# Patient Record
Sex: Female | Born: 1960 | Race: White | Hispanic: No | Smoking: Current every day smoker
Health system: Southern US, Community
[De-identification: ages and names within clinical notes are randomized; demographics above are authoritative.]

## PROBLEM LIST (undated history)

## (undated) DIAGNOSIS — E119 Type 2 diabetes mellitus without complications: Secondary | ICD-10-CM

## (undated) DIAGNOSIS — I739 Peripheral vascular disease, unspecified: Secondary | ICD-10-CM

## (undated) DIAGNOSIS — I252 Old myocardial infarction: Secondary | ICD-10-CM

## (undated) DIAGNOSIS — J449 Chronic obstructive pulmonary disease, unspecified: Secondary | ICD-10-CM

## (undated) DIAGNOSIS — F32A Depression, unspecified: Secondary | ICD-10-CM

## (undated) DIAGNOSIS — E1121 Type 2 diabetes mellitus with diabetic nephropathy: Secondary | ICD-10-CM

## (undated) DIAGNOSIS — I10 Essential (primary) hypertension: Secondary | ICD-10-CM

## (undated) DIAGNOSIS — F419 Anxiety disorder, unspecified: Secondary | ICD-10-CM

## (undated) DIAGNOSIS — F329 Major depressive disorder, single episode, unspecified: Secondary | ICD-10-CM

## (undated) DIAGNOSIS — I2510 Atherosclerotic heart disease of native coronary artery without angina pectoris: Secondary | ICD-10-CM

## (undated) HISTORY — PX: AMPUTATION TOE: SHX6595

## (undated) HISTORY — DX: Type 2 diabetes mellitus with diabetic nephropathy: E11.21

## (undated) HISTORY — PX: KIDNEY SURGERY: SHX687

## (undated) HISTORY — DX: Chronic obstructive pulmonary disease, unspecified: J44.9

## (undated) HISTORY — DX: Peripheral vascular disease, unspecified: I73.9

## (undated) HISTORY — DX: Depression, unspecified: F32.A

## (undated) HISTORY — DX: Essential (primary) hypertension: I10

## (undated) HISTORY — DX: Anxiety disorder, unspecified: F41.9

## (undated) HISTORY — DX: Type 2 diabetes mellitus without complications: E11.9

## (undated) HISTORY — PX: CARDIAC SURGERY: SHX584

## (undated) HISTORY — PX: BREAST EXCISIONAL BIOPSY: SUR124

## (undated) HISTORY — DX: Old myocardial infarction: I25.2

## (undated) HISTORY — DX: Atherosclerotic heart disease of native coronary artery without angina pectoris: I25.10

---
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
## 1998-04-21 ENCOUNTER — Emergency Department (HOSPITAL_COMMUNITY): Admission: EM | Admit: 1998-04-21 | Discharge: 1998-04-21 | Payer: Self-pay | Admitting: Emergency Medicine

## 1999-02-14 ENCOUNTER — Encounter: Admission: RE | Admit: 1999-02-14 | Discharge: 1999-02-14 | Payer: Self-pay | Admitting: Internal Medicine

## 2014-12-05 DIAGNOSIS — I2510 Atherosclerotic heart disease of native coronary artery without angina pectoris: Secondary | ICD-10-CM | POA: Insufficient documentation

## 2014-12-05 DIAGNOSIS — I739 Peripheral vascular disease, unspecified: Secondary | ICD-10-CM | POA: Insufficient documentation

## 2014-12-06 DIAGNOSIS — F17210 Nicotine dependence, cigarettes, uncomplicated: Secondary | ICD-10-CM | POA: Insufficient documentation

## 2014-12-06 DIAGNOSIS — E1122 Type 2 diabetes mellitus with diabetic chronic kidney disease: Secondary | ICD-10-CM | POA: Insufficient documentation

## 2014-12-06 DIAGNOSIS — G894 Chronic pain syndrome: Secondary | ICD-10-CM | POA: Insufficient documentation

## 2015-06-08 DIAGNOSIS — F331 Major depressive disorder, recurrent, moderate: Secondary | ICD-10-CM | POA: Insufficient documentation

## 2015-09-10 DIAGNOSIS — J410 Simple chronic bronchitis: Secondary | ICD-10-CM | POA: Insufficient documentation

## 2015-09-10 DIAGNOSIS — M5137 Other intervertebral disc degeneration, lumbosacral region: Secondary | ICD-10-CM | POA: Insufficient documentation

## 2015-09-10 DIAGNOSIS — M51379 Other intervertebral disc degeneration, lumbosacral region without mention of lumbar back pain or lower extremity pain: Secondary | ICD-10-CM | POA: Insufficient documentation

## 2015-09-10 DIAGNOSIS — G8929 Other chronic pain: Secondary | ICD-10-CM | POA: Insufficient documentation

## 2015-09-10 DIAGNOSIS — M5136 Other intervertebral disc degeneration, lumbar region: Secondary | ICD-10-CM | POA: Insufficient documentation

## 2015-12-11 DIAGNOSIS — K219 Gastro-esophageal reflux disease without esophagitis: Secondary | ICD-10-CM | POA: Insufficient documentation

## 2016-01-21 DIAGNOSIS — F411 Generalized anxiety disorder: Secondary | ICD-10-CM | POA: Insufficient documentation

## 2016-05-08 DIAGNOSIS — M47817 Spondylosis without myelopathy or radiculopathy, lumbosacral region: Secondary | ICD-10-CM | POA: Insufficient documentation

## 2017-02-03 DIAGNOSIS — E1169 Type 2 diabetes mellitus with other specified complication: Secondary | ICD-10-CM | POA: Insufficient documentation

## 2017-02-03 DIAGNOSIS — E785 Hyperlipidemia, unspecified: Secondary | ICD-10-CM | POA: Insufficient documentation

## 2017-12-22 DIAGNOSIS — E1142 Type 2 diabetes mellitus with diabetic polyneuropathy: Secondary | ICD-10-CM | POA: Insufficient documentation

## 2017-12-22 DIAGNOSIS — E1165 Type 2 diabetes mellitus with hyperglycemia: Secondary | ICD-10-CM | POA: Insufficient documentation

## 2017-12-22 DIAGNOSIS — E118 Type 2 diabetes mellitus with unspecified complications: Secondary | ICD-10-CM | POA: Insufficient documentation

## 2019-10-20 ENCOUNTER — Other Ambulatory Visit: Payer: Self-pay

## 2019-10-21 ENCOUNTER — Encounter: Payer: Self-pay | Admitting: Family Medicine

## 2019-10-21 ENCOUNTER — Ambulatory Visit (INDEPENDENT_AMBULATORY_CARE_PROVIDER_SITE_OTHER): Payer: Medicare Other | Admitting: Family Medicine

## 2019-10-21 VITALS — BP 146/92 | HR 101 | Temp 96.2°F | Ht 65.0 in | Wt 142.8 lb

## 2019-10-21 DIAGNOSIS — E1122 Type 2 diabetes mellitus with diabetic chronic kidney disease: Secondary | ICD-10-CM | POA: Diagnosis not present

## 2019-10-21 DIAGNOSIS — E11628 Type 2 diabetes mellitus with other skin complications: Secondary | ICD-10-CM

## 2019-10-21 DIAGNOSIS — I2510 Atherosclerotic heart disease of native coronary artery without angina pectoris: Secondary | ICD-10-CM | POA: Diagnosis not present

## 2019-10-21 DIAGNOSIS — N183 Chronic kidney disease, stage 3 unspecified: Secondary | ICD-10-CM

## 2019-10-21 DIAGNOSIS — E1159 Type 2 diabetes mellitus with other circulatory complications: Secondary | ICD-10-CM

## 2019-10-21 DIAGNOSIS — I739 Peripheral vascular disease, unspecified: Secondary | ICD-10-CM

## 2019-10-21 DIAGNOSIS — E1169 Type 2 diabetes mellitus with other specified complication: Secondary | ICD-10-CM

## 2019-10-21 DIAGNOSIS — S98111A Complete traumatic amputation of right great toe, initial encounter: Secondary | ICD-10-CM

## 2019-10-21 DIAGNOSIS — E1142 Type 2 diabetes mellitus with diabetic polyneuropathy: Secondary | ICD-10-CM

## 2019-10-21 DIAGNOSIS — I10 Essential (primary) hypertension: Secondary | ICD-10-CM | POA: Insufficient documentation

## 2019-10-21 DIAGNOSIS — I152 Hypertension secondary to endocrine disorders: Secondary | ICD-10-CM

## 2019-10-21 DIAGNOSIS — E785 Hyperlipidemia, unspecified: Secondary | ICD-10-CM

## 2019-10-21 DIAGNOSIS — Z9861 Coronary angioplasty status: Secondary | ICD-10-CM

## 2019-10-21 DIAGNOSIS — J410 Simple chronic bronchitis: Secondary | ICD-10-CM

## 2019-10-21 LAB — BAYER DCA HB A1C WAIVED: HB A1C (BAYER DCA - WAIVED): 7.2 % — ABNORMAL HIGH (ref <7.0)

## 2019-10-21 MED ORDER — LISINOPRIL 10 MG PO TABS: 10.0000 mg | ORAL_TABLET | Freq: Every day | ORAL | 3 refills | Status: DC

## 2019-10-21 NOTE — Patient Instructions (Addendum)
Continue to monitor your blood sugars as we discussed and record them. Bring the log to your next appointment.  Take your medications as directed.    Goal Blood glucose:    Fasting (before meals) = 80 to 130   Within 2 hours of eating = less than 180   Understanding your Hemoglobin A1c:     Diabetes Mellitus and Nutrition    I think that you would greatly benefit from seeing a nutritionist. If this is something you are interested in, please call Dr Sykes at [phone] to schedule an appointment.   When you have diabetes (diabetes mellitus), it is very important to have healthy eating habits because your blood sugar (glucose) levels are greatly affected by what you eat and drink. Eating healthy foods in the appropriate amounts, at about the same times every day, can help you: Control your blood glucose. Lower your risk of heart disease. Improve your blood pressure. Reach or maintain a healthy weight.  Every person with diabetes is different, and each person has different needs for a meal plan. Your health care provider may recommend that you work with a diet and nutrition specialist (dietitian) to make a meal plan that is best for you. Your meal plan may vary depending on factors such as: The calories you need. The medicines you take. Your weight. Your blood glucose, blood pressure, and cholesterol levels. Your activity level. Other health conditions you have, such as heart or kidney disease.  How do carbohydrates affect me? Carbohydrates affect your blood glucose level more than any other type of food. Eating carbohydrates naturally increases the amount of glucose in your blood. Carbohydrate counting is a method for keeping track of how many carbohydrates you eat. Counting carbohydrates is important to keep your blood glucose at a healthy level, especially if you use insulin or take certain oral diabetes medicines. It is important to know how many carbohydrates you can safely  have in each meal. This is different for every person. Your dietitian can help you calculate how many carbohydrates you should have at each meal and for snack. Foods that contain carbohydrates include: Bread, cereal, rice, pasta, and crackers. Potatoes and corn. Peas, beans, and lentils. Milk and yogurt. Fruit and juice. Desserts, such as cakes, cookies, ice cream, and candy.  How does alcohol affect me? Alcohol can cause a sudden decrease in blood glucose (hypoglycemia), especially if you use insulin or take certain oral diabetes medicines. Hypoglycemia can be a life-threatening condition. Symptoms of hypoglycemia (sleepiness, dizziness, and confusion) are similar to symptoms of having too much alcohol. If your health care provider says that alcohol is safe for you, follow these guidelines: Limit alcohol intake to no more than 1 drink per day for nonpregnant women and 2 drinks per day for men. One drink equals 12 oz of beer, 5 oz of wine, or 1 oz of hard liquor. Do not drink on an empty stomach. Keep yourself hydrated with water, diet soda, or unsweetened iced tea. Keep in mind that regular soda, juice, and other mixers may contain a lot of sugar and must be counted as carbohydrates.  What are tips for following this plan?  Reading food labels Start by checking the serving size on the label. The amount of calories, carbohydrates, fats, and other nutrients listed on the label are based on one serving of the food. Many foods contain more than one serving per package. Check the total grams (g) of carbohydrates in one serving. You can calculate the   number of servings of carbohydrates in one serving by dividing the total carbohydrates by 15. For example, if a food has 30 g of total carbohydrates, it would be equal to 2 servings of carbohydrates. Check the number of grams (g) of saturated and trans fats in one serving. Choose foods that have low or no amount of these fats. Check the number of  milligrams (mg) of sodium in one serving. Most people should limit total sodium intake to less than 2,300 mg per day. Always check the nutrition information of foods labeled as "low-fat" or "nonfat". These foods may be higher in added sugar or refined carbohydrates and should be avoided. Talk to your dietitian to identify your daily goals for nutrients listed on the label.  Shopping Avoid buying canned, premade, or processed foods. These foods tend to be high in fat, sodium, and added sugar. Shop around the outside edge of the grocery store. This includes fresh fruits and vegetables, bulk grains, fresh meats, and fresh dairy.  Cooking Use low-heat cooking methods, such as baking, instead of high-heat cooking methods like deep frying. Cook using healthy oils, such as olive, canola, or sunflower oil. Avoid cooking with butter, cream, or high-fat meats.  Meal planning Eat meals and snacks regularly, preferably at the same times every day. Avoid going long periods of time without eating. Eat foods high in fiber, such as fresh fruits, vegetables, beans, and whole grains. Talk to your dietitian about how many servings of carbohydrates you can eat at each meal. Eat 4-6 ounces of lean protein each day, such as lean meat, chicken, fish, eggs, or tofu. 1 ounce is equal to 1 ounce of meat, chicken, or fish, 1 egg, or 1/4 cup of tofu. Eat some foods each day that contain healthy fats, such as avocado, nuts, seeds, and fish.  Lifestyle  Check your blood glucose regularly. Exercise at least 30 minutes 5 or more days each week, or as told by your health care provider. Take medicines as told by your health care provider. Do not use any products that contain nicotine or tobacco, such as cigarettes and e-cigarettes. If you need help quitting, ask your health care provider. Work with a counselor or diabetes educator to identify strategies to manage stress and any emotional and social challenges.  What are  some questions to ask my health care provider? Do I need to meet with a diabetes educator? Do I need to meet with a dietitian? What number can I call if I have questions? When are the best times to check my blood glucose?  Where to find more information: American Diabetes Association: diabetes.org/food-and-fitness/food Academy of Nutrition and Dietetics: www.eatright.org/resources/health/diseases-and-conditions/diabetes National Institute of Diabetes and Digestive and Kidney Diseases (NIH): www.niddk.nih.gov/health-information/diabetes/overview/diet-eating-physical-activity  Summary A healthy meal plan will help you control your blood glucose and maintain a healthy lifestyle. Working with a diet and nutrition specialist (dietitian) can help you make a meal plan that is best for you. Keep in mind that carbohydrates and alcohol have immediate effects on your blood glucose levels. It is important to count carbohydrates and to use alcohol carefully. This information is not intended to replace advice given to you by your health care provider. Make sure you discuss any questions you have with your health care provider. Document Released: 06/26/2005 Document Revised: 11/03/2016 Document Reviewed: 11/03/2016 Elsevier Interactive Patient Education  2018 [employer]  DASH Eating Plan DASH stands for "Dietary Approaches to Stop Hypertension." The DASH eating plan is a healthy eating plan that has   been shown to reduce high blood pressure (hypertension). Additional health benefits may include reducing the risk of type 2 diabetes mellitus, heart disease, and stroke. The DASH eating plan may also help with weight loss.  WHAT DO I NEED TO KNOW ABOUT THE DASH EATING PLAN? For the DASH eating plan, you will follow these general guidelines: Choose foods with a percent daily value for sodium of less than 5% (as listed on the food label). Use salt-free seasonings or herbs instead of table salt or sea  salt. Check with your health care provider or pharmacist before using salt substitutes. Eat lower-sodium products, often labeled as "lower sodium" or "no salt added." Eat fresh foods. Eat more vegetables, fruits, and low-fat dairy products. Choose whole grains. Look for the word "whole" as the first word in the ingredient list. Choose fish and skinless chicken or turkey more often than red meat. Limit fish, poultry, and meat to 6 oz (170 g) each day. Limit sweets, desserts, sugars, and sugary drinks. Choose heart-healthy fats. Limit cheese to 1 oz (28 g) per day. Eat more home-cooked food and less restaurant, buffet, and fast food. Limit fried foods. Cook foods using methods other than frying. Limit canned vegetables. If you do use them, rinse them well to decrease the sodium. When eating at a restaurant, ask that your food be prepared with less salt, or no salt if possible.  WHAT FOODS CAN I EAT? Seek help from a dietitian for individual calorie needs.  Grains Whole grain or whole wheat bread. Brown rice. Whole grain or whole wheat pasta. Quinoa, bulgur, and whole grain cereals. Low-sodium cereals. Corn or whole wheat flour tortillas. Whole grain cornbread. Whole grain crackers. Low-sodium crackers.  Vegetables Fresh or frozen vegetables (raw, steamed, roasted, or grilled). Low-sodium or reduced-sodium tomato and vegetable juices. Low-sodium or reduced-sodium tomato sauce and paste. Low-sodium or reduced-sodium canned vegetables.   Fruits All fresh, canned (in natural juice), or frozen fruits.  Meat and Other Protein Products Ground beef (85% or leaner), grass-fed beef, or beef trimmed of fat. Skinless chicken or turkey. Ground chicken or turkey. Pork trimmed of fat. All fish and seafood. Eggs. Dried beans, peas, or lentils. Unsalted nuts and seeds. Unsalted canned beans.  Dairy Low-fat dairy products, such as skim or 1% milk, 2% or reduced-fat cheeses, low-fat ricotta or cottage  cheese, or plain low-fat yogurt. Low-sodium or reduced-sodium cheeses.  Fats and Oils Tub margarines without trans fats. Light or reduced-fat mayonnaise and salad dressings (reduced sodium). Avocado. Safflower, olive, or canola oils. Natural peanut or almond butter.  Other Unsalted popcorn and pretzels. The items listed above may not be a complete list of recommended foods or beverages. Contact your dietitian for more options.  WHAT FOODS ARE NOT RECOMMENDED?  Grains White bread. White pasta. White rice. Refined cornbread. Bagels and croissants. Crackers that contain trans fat.  Vegetables Creamed or fried vegetables. Vegetables in a cheese sauce. Regular canned vegetables. Regular canned tomato sauce and paste. Regular tomato and vegetable juices.  Fruits Dried fruits. Canned fruit in light or heavy syrup. Fruit juice.  Meat and Other Protein Products Fatty cuts of meat. Ribs, chicken wings, bacon, sausage, bologna, salami, chitterlings, fatback, hot dogs, bratwurst, and packaged luncheon meats. Salted nuts and seeds. Canned beans with salt.  Dairy Whole or 2% milk, cream, half-and-half, and cream cheese. Whole-fat or sweetened yogurt. Full-fat cheeses or blue cheese. Nondairy creamers and whipped toppings. Processed cheese, cheese spreads, or cheese curds.  Condiments Onion and garlic salt,   seasoned salt, table salt, and sea salt. Canned and packaged gravies. Worcestershire sauce. Tartar sauce. Barbecue sauce. Teriyaki sauce. Soy sauce, including reduced sodium. Steak sauce. Fish sauce. Oyster sauce. Cocktail sauce. Horseradish. Ketchup and mustard. Meat flavorings and tenderizers. Bouillon cubes. Hot sauce. Tabasco sauce. Marinades. Taco seasonings. Relishes.  Fats and Oils Butter, stick margarine, lard, shortening, ghee, and bacon fat. Coconut, palm kernel, or palm oils. Regular salad dressings.  Other Pickles and olives. Salted popcorn and pretzels.  The items listed above may  not be a complete list of foods and beverages to avoid. Contact your dietitian for more information.  WHERE CAN I FIND MORE INFORMATION? National Heart, Lung, and Blood Institute: www.nhlbi.nih.gov/health/health-topics/topics/dash/ Document Released: 09/18/2011 Document Revised: 02/13/2014 Document Reviewed: 08/03/2013 [employer] Patient Information 2015 [employer]. This information is not intended to replace advice given to you by your health care provider. Make sure you discuss any questions you have with your health care provider.   I think that you would greatly benefit from seeing a nutritionist.  If you are interested, please call Dr Sykes at [phone] to schedule an appointment.   

## 2019-10-21 NOTE — Progress Notes (Signed)
Subjective:  Patient ID: Felicia Figueroa, female    DOB: 01-31-1961, 59 y.o.   MRN: [id number]  Patient Care Team: Baruch Gouty, FNP as PCP - General (Family Medicine)   Chief Complaint:  New Patient (Initial Visit)   HPI: Felicia Figueroa is a 59 y.o. female presenting on 10/21/2019 for New Patient (Initial Visit)  Patient presents today to establish care with new PCP.  Patient states she was referred by Dr. Irving Shows.  Patient was previously seeing Cordelia Pen.  Patient last saw her in October 2020.  Patient has recently been under the care of Dr. Irving Shows for the right foot issues due to her diabetes.  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  She reports she needs diabetic shoes and needs a prescription for those.  He does follow with Dr. Irving Shows on a regular basis for her diabetic foot disease and polyneuropathy with her diabetes.  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  She was previously taking ibuprofen to manage this pain and does not wish to take anything stronger.  Discussed the importance of avoiding NSAIDs with diabetes, chronic kidney disease, and hypertension.  Patient aware I will review labs today and determine an appropriate treatment for her chronic pain.  Patient does have coronary artery disease and did have a stent placed.  She has not followed up with cardiology in several years.  States her previous PCP did not tell her she needed to.  She does have simple bronchitis and denies use of inhalers for this.  States she has never been on medication for this in the past.  She is a current everyday smoker.  She does have acid reflux that is well controlled with pantoprazole.  No symptoms of esophagitis.  She does have hyperlipidemia and is on statin therapy without associated side effects.  She does not watch her diet or exercise on a regular basis.  She reports her hypertension is well controlled.  She denies  headache, chest pain, dizziness, palpitations, confusion, weakness, or leg swelling.   Relevant past medical, surgical, family, and social history reviewed and updated as indicated.  Allergies and medications reviewed and updated. Date reviewed: Chart in Epic.   Past Medical History:  Diagnosis Date  . Anxiety   . COPD (chronic obstructive pulmonary disease) ([place])   . Depression   . Diabetes mellitus without complication ([place])   . Hypertension     Past Surgical History:  Procedure Laterality Date  . AMPUTATION TOE      Social History   Socioeconomic History  . Marital status: Divorced    Spouse name: Not on file  . Number of children: Not on file  . Years of education: Not on file  . Highest education level: Not on file  Occupational History  . Not on file  Tobacco Use  . Smoking status: Current Every Day Smoker    Packs/day: 1.00    Years: 35.00    Pack years: 35.00    Types: Cigarettes  . Smokeless tobacco: Never Used  Substance and Sexual Activity  . Alcohol use: Not Currently  . Drug use: Never  . Sexual activity: Not on file  Other Topics Concern  . Not on file  Social History Narrative  . Not on file   Social Determinants of Health   Financial Resource Strain:   . Difficulty of Paying Living Expenses: Not on file  Food Insecurity:   .  Worried About Charity fundraiser in the Last Year: Not on file  . Ran Out of Food in the Last Year: Not on file  Transportation Needs:   . Lack of Transportation (Medical): Not on file  . Lack of Transportation (Non-Medical): Not on file  Physical Activity:   . Days of Exercise per Week: Not on file  . Minutes of Exercise per Session: Not on file  Stress:   . Feeling of Stress : Not on file  Social Connections:   . Frequency of Communication with Friends and Family: Not on file  . Frequency of Social Gatherings with Friends and Family: Not on file  . Attends Religious Services: Not on file  . Active Member of  Clubs or Organizations: Not on file  . Attends Archivist Meetings: Not on file  . Marital Status: Not on file  Intimate Partner Violence:   . Fear of Current or Ex-Partner: Not on file  . Emotionally Abused: Not on file  . Physically Abused: Not on file  . Sexually Abused: Not on file    Outpatient Encounter Medications as of 10/21/2019  Medication Sig  . Accu-Chek FastClix Lancets MISC Use to check blood sugar 3 daily  . atorvastatin (LIPITOR) 40 MG tablet Take 40 mg by mouth every morning.  . BD VEO INSULIN SYRINGE U/F 31G X 15/64" 0.3 ML MISC See admin instructions. with insulin  . gabapentin (NEURONTIN) 600 MG tablet Take by mouth.  Marland Kitchen glucose blood test strip Use to check blood sugar 3 daily  . insulin NPH-regular Human (HUMULIN 70/30) (70-30) 100 UNIT/ML injection INJECT 28 UNITS IN THE MORNING AND 26 UNITS IN THE EVENING  . Insulin Syringe-Needle U-100 31G X 15/64" 0.3 ML MISC USE AS DIRECTED WITH INSULIN  . mupirocin ointment (BACTROBAN) 2 % APPLY TO AFFECTED AREASlON RIGHT GREAT TOE TWICE A DAY  . pantoprazole (PROTONIX) 40 MG tablet TAKE ONE TABLET (40 MG TOTAL) BY MOUTH EVERY MORNING.  . [DISCONTINUED] ibuprofen (ADVIL) 800 MG tablet TAKE 1 TABLET BY MOUTH EVERY 6 HOURS AS NEEDED FOR PAIN  . lisinopril (ZESTRIL) 10 MG tablet Take 1 tablet (10 mg total) by mouth daily.   No facility-administered encounter medications on file as of 10/21/2019.    Allergies  Allergen Reactions  . Cephalexin Other (See Comments)    unknown  . Hydrocodone-Acetaminophen Rash    Itching, vomiting  . Sulfa Antibiotics Rash    Review of Systems  Constitutional: Negative for activity change, appetite change, chills, diaphoresis, fatigue, fever and unexpected weight change.  HENT: Negative.  Negative for sore throat and trouble swallowing.   Eyes: Negative.  Negative for photophobia and visual disturbance.  Respiratory: Negative for cough, choking, chest tightness, shortness of breath  and wheezing.   Cardiovascular: Negative for chest pain, palpitations and leg swelling.  Gastrointestinal: Negative for abdominal pain, blood in stool, constipation, diarrhea, nausea and vomiting.  Endocrine: Negative.  Negative for polydipsia, polyphagia and polyuria.  Genitourinary: Negative for decreased urine volume, difficulty urinating, dysuria, frequency and urgency.  Musculoskeletal: Positive for arthralgias, back pain, gait problem and myalgias.  Skin: Negative.   Allergic/Immunologic: Negative.   Neurological: Negative for dizziness, tremors, seizures, syncope, facial asymmetry, speech difficulty, light-headedness, numbness and headaches.  Hematological: Negative.   Psychiatric/Behavioral: Negative for confusion, hallucinations, sleep disturbance and suicidal ideas.  All other systems reviewed and are negative.       Objective:  BP (!) 146/92   Pulse (!) 101   Temp (!)  96.2 F (35.7 C) (Temporal)   Ht 5' 5"  (1.651 m)   Wt 142 lb 12.8 oz (64.8 kg)   SpO2 98%   BMI 23.76 kg/m    Wt Readings from Last 3 Encounters:  10/21/19 142 lb 12.8 oz (64.8 kg)    Physical Exam Vitals and nursing note reviewed.  Constitutional:      General: She is not in acute distress.    Appearance: Normal appearance. She is well-developed, well-groomed and normal weight. She is not ill-appearing, toxic-appearing or diaphoretic.  HENT:     Head: Normocephalic and atraumatic.     Jaw: There is normal jaw occlusion.     Right Ear: Hearing normal.     Left Ear: Hearing normal.     Nose: Nose normal.     Mouth/Throat:     Lips: Pink.     Mouth: Mucous membranes are moist.     Pharynx: Oropharynx is clear. Uvula midline.  Eyes:     General: Lids are normal.     Extraocular Movements: Extraocular movements intact.     Conjunctiva/sclera: Conjunctivae normal.     Pupils: Pupils are equal, round, and reactive to light.  Neck:     Thyroid: No thyroid mass, thyromegaly or thyroid tenderness.       Vascular: No carotid bruit or JVD.     Trachea: Trachea and phonation normal.  Cardiovascular:     Rate and Rhythm: Normal rate and regular rhythm.     Chest Wall: PMI is not displaced.     Pulses: Normal pulses.     Heart sounds: Normal heart sounds. No murmur. No friction rub. No gallop.   Pulmonary:     Effort: Pulmonary effort is normal. No respiratory distress.     Breath sounds: Normal breath sounds. No wheezing.  Abdominal:     General: Bowel sounds are normal. There is no distension or abdominal bruit.     Palpations: Abdomen is soft. There is no hepatomegaly or splenomegaly.     Tenderness: There is no abdominal tenderness. There is no right CVA tenderness or left CVA tenderness.     Hernia: No hernia is present.  Musculoskeletal:        General: Normal range of motion.     Cervical back: Normal range of motion and neck supple.     Right lower leg: No edema.     Left lower leg: No edema.     Right Lower Extremity: (right great toe) Lymphadenopathy:     Cervical: No cervical adenopathy.  Skin:    General: Skin is warm and dry.     Capillary Refill: Capillary refill takes less than 2 seconds.     Coloration: Skin is not cyanotic, jaundiced or pale.     Findings: No rash.  Neurological:     General: No focal deficit present.     Mental Status: She is alert and oriented to person, place, and time.     Cranial Nerves: Cranial nerves are intact. No cranial nerve deficit.     Sensory: Sensation is intact. No sensory deficit.     Motor: Motor function is intact. No weakness.     Coordination: Coordination is intact. Coordination normal.     Gait: Gait abnormal (antlgic, postop shoe on right foot).     Deep Tendon Reflexes: Reflexes are normal and symmetric. Reflexes normal.  Psychiatric:        Attention and Perception: Attention and perception normal.  Mood and Affect: Mood and affect normal.        Speech: Speech normal.        Behavior: Behavior normal.  Behavior is cooperative.        Thought Content: Thought content normal.        Cognition and Memory: Cognition and memory normal.        Judgment: Judgment normal.     No results found for this or any previous visit.     Pertinent labs & imaging results that were available during my care of the patient were reviewed by me and considered in my medical decision making.  Assessment & Plan:  Cledith was seen today for new patient (initial visit).  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  No changes in regimen today.  Other labs pending.  Scription for diabetic shoes provided to patient today.  Diet and exercise discussed in detail.  Follow-up in 3 months or sooner if needed. -     Ambulatory referral to Cardiology -     For home use only DME Other see comment -     CMP14+EGFR -     Microalbumin / creatinine urine ratio -     hgba1c  CKD stage 3 due to type 2 diabetes mellitus (HCC) No changes in urinary output.  Labs pending.  Report any changes in urinary output or swelling of extremities. -     CBC with Differential/Platelet -     CMP14+EGFR  PVD (peripheral vascular disease) with claudication (HCC) Continue to follow-up with Dr. Irving Shows.  Compression socks daily.  Report any new or worsening symptoms.  Will make referral to cardiology to get established due to prior history of MI, stent placement, and PVD. -     Ambulatory referral to Cardiology -     For home use only DME Other see comment  CAD S/P percutaneous coronary angioplasty Referral placed to cardiology for patient to get established as she has not seen in several years.  Lipid panel ordered today. -     Ambulatory referral to Cardiology -     Lipid panel  Simple chronic bronchitis [facility]) Patient denies prior treatment for chronic bronchitis.  No ongoing cough, shortness of breath, or sputum production.  We will continue to monitor and placed on  therapy if warranted.  Hyperlipidemia due to type 2 diabetes mellitus ([place]) Diet encouraged - increase intake of fresh fruits and vegetables, increase intake of lean proteins. Bake, broil, or grill foods. Avoid fried, greasy, and fatty foods. Avoid fast foods. Increase intake of fiber-rich whole grains. Exercise encouraged - at least 150 minutes per week and advance as tolerated.  Goal BMI < 25. Continue medications as prescribed. Follow up in 3-6 months as discussed.  -     Ambulatory referral to Cardiology -     Lipid panel  Diabetic polyneuropathy associated with type 2 diabetes mellitus ([place]) Appoint with Dr. Irving Shows.  Wrote for diabetic shoes today.  Report any new or worsening symptoms. -     For home use only DME Other see comment  Hypertension associated with diabetes ([place]) BP poorly controlled. Lisinopril initiated today. Goal BP is 130/80. Pt aware to report any persistent high or low readings. DASH diet and exercise encouraged. Exercise at least 150 minutes per week and increase as tolerated. Goal BMI > 25. Stress management encouraged. Avoid nicotine and tobacco product use. Avoid excessive alcohol and  NSAID's. Avoid more than 2000 mg of sodium daily. Medications as prescribed. Follow up as scheduled in 2 weeks for BP and CMP.  -     lisinopril (ZESTRIL) 10 MG tablet; Take 1 tablet (10 mg total) by mouth daily. -     Ambulatory referral to Cardiology -     CBC with Differential/Platelet -     CMP14+EGFR -     Lipid panel -     Thyroid Panel With TSH -     Microalbumin / creatinine urine ratio   pt to schedule appointment for CPE.   Continue all other maintenance medications.  Follow up plan: Return in about 2 weeks (around 11/04/2019), or if symptoms worsen or fail to improve, for BP, labs.  Continue healthy lifestyle choices, including diet (rich in fruits, vegetables, and lean proteins, and low in salt and simple carbohydrates) and exercise (at least 30 minutes of moderate  physical activity daily).  Educational handout given for DM, DASH diet  The above assessment and management plan was discussed with the patient. The patient verbalized understanding of and has agreed to the management plan. Patient is aware to call the clinic if they develop any new symptoms or if symptoms persist or worsen. Patient is aware when to return to the clinic for a follow-up visit. Patient educated on when it is appropriate to go to the emergency department.   Monia Pouch, FNP-C [facility] [phone]

## 2019-10-22 LAB — CMP14+EGFR
ALT: 8 IU/L (ref 0–32)
AST: 15 IU/L (ref 0–40)
Albumin/Globulin Ratio: 1.6 (ref 1.2–2.2)
Albumin: 4 g/dL (ref 3.8–4.9)
Alkaline Phosphatase: 118 IU/L — ABNORMAL HIGH (ref 39–117)
BUN/Creatinine Ratio: 6 — ABNORMAL LOW (ref 9–23)
BUN: 6 mg/dL (ref 6–24)
Bilirubin Total: <0.2 mg/dL (ref 0.0–1.2)
CO2: 24 mmol/L (ref 20–29)
Calcium: 9 mg/dL (ref 8.7–10.2)
Chloride: 102 mmol/L (ref 96–106)
Creatinine, Ser: 1.03 mg/dL — ABNORMAL HIGH (ref 0.57–1.00)
GFR calc Af Amer: 69 mL/min/{1.73_m2} (ref >59)
GFR calc non Af Amer: 60 mL/min/{1.73_m2} (ref >59)
Globulin, Total: 2.5 g/dL (ref 1.5–4.5)
Glucose: 210 mg/dL — ABNORMAL HIGH (ref 65–99)
Potassium: 4.2 mmol/L (ref 3.5–5.2)
Sodium: 139 mmol/L (ref 134–144)
Total Protein: 6.5 g/dL (ref 6.0–8.5)

## 2019-10-22 LAB — CBC WITH DIFFERENTIAL/PLATELET
Basophils Absolute: 0.1 10*3/uL (ref 0.0–0.2)
Basos: 1 %
EOS (ABSOLUTE): 0.2 10*3/uL (ref 0.0–0.4)
Eos: 3 %
Hematocrit: 39.2 % (ref 34.0–46.6)
Hemoglobin: 13.5 g/dL (ref 11.1–15.9)
Immature Grans (Abs): 0 10*3/uL (ref 0.0–0.1)
Immature Granulocytes: 0 %
Lymphocytes Absolute: 2.5 10*3/uL (ref 0.7–3.1)
Lymphs: 37 %
MCH: 30.6 pg (ref 26.6–33.0)
MCHC: 34.4 g/dL (ref 31.5–35.7)
MCV: 89 fL (ref 79–97)
Monocytes Absolute: 0.4 10*3/uL (ref 0.1–0.9)
Monocytes: 6 %
Neutrophils Absolute: 3.6 10*3/uL (ref 1.4–7.0)
Neutrophils: 53 %
Platelets: 248 10*3/uL (ref 150–450)
RBC: 4.41 x10E6/uL (ref 3.77–5.28)
RDW: 14 % (ref 11.7–15.4)
WBC: 6.7 10*3/uL (ref 3.4–10.8)

## 2019-10-22 LAB — LIPID PANEL
Chol/HDL Ratio: 4.8 ratio — ABNORMAL HIGH (ref 0.0–4.4)
Cholesterol, Total: 164 mg/dL (ref 100–199)
HDL: 34 mg/dL — ABNORMAL LOW (ref >39)
LDL Chol Calc (NIH): 105 mg/dL — ABNORMAL HIGH (ref 0–99)
Triglycerides: 142 mg/dL (ref 0–149)
VLDL Cholesterol Cal: 25 mg/dL (ref 5–40)

## 2019-10-22 LAB — THYROID PANEL WITH TSH
Free Thyroxine Index: 2 (ref 1.2–4.9)
T3 Uptake Ratio: 23 % — ABNORMAL LOW (ref 24–39)
T4, Total: 8.5 ug/dL (ref 4.5–12.0)
TSH: 2.03 u[IU]/mL (ref 0.450–4.500)

## 2019-10-22 LAB — MICROALBUMIN / CREATININE URINE RATIO
Creatinine, Urine: 19.2 mg/dL
Microalb/Creat Ratio: <16 mg/g creat (ref 0–29)
Microalbumin, Urine: <3 ug/mL

## 2019-11-04 ENCOUNTER — Other Ambulatory Visit: Payer: Self-pay

## 2019-11-07 ENCOUNTER — Other Ambulatory Visit: Payer: Self-pay

## 2019-11-07 ENCOUNTER — Encounter: Payer: Self-pay | Admitting: Family Medicine

## 2019-11-07 ENCOUNTER — Ambulatory Visit (INDEPENDENT_AMBULATORY_CARE_PROVIDER_SITE_OTHER): Payer: Medicare Other | Admitting: Family Medicine

## 2019-11-07 VITALS — BP 160/87 | HR 89 | Temp 97.5°F | Resp 20 | Ht 65.0 in | Wt 142.0 lb

## 2019-11-07 DIAGNOSIS — E1159 Type 2 diabetes mellitus with other circulatory complications: Secondary | ICD-10-CM | POA: Diagnosis not present

## 2019-11-07 DIAGNOSIS — I152 Hypertension secondary to endocrine disorders: Secondary | ICD-10-CM

## 2019-11-07 DIAGNOSIS — I10 Essential (primary) hypertension: Secondary | ICD-10-CM

## 2019-11-07 MED ORDER — LISINOPRIL-HYDROCHLOROTHIAZIDE 10-12.5 MG PO TABS: 1.0000 | ORAL_TABLET | Freq: Every day | ORAL | 3 refills | Status: DC

## 2019-11-07 NOTE — Patient Instructions (Signed)
DASH Eating Plan DASH stands for "Dietary Approaches to Stop Hypertension." The DASH eating plan is a healthy eating plan that has been shown to reduce high blood pressure (hypertension). Additional health benefits may include reducing the risk of type 2 diabetes mellitus, heart disease, and stroke. The DASH eating plan may also help with weight loss.  WHAT DO I NEED TO KNOW ABOUT THE DASH EATING PLAN? For the DASH eating plan, you will follow these general guidelines:  Choose foods with a percent daily value for sodium of less than 5% (as listed on the food label).  Use salt-free seasonings or herbs instead of table salt or sea salt.  Check with your health care provider or pharmacist before using salt substitutes.  Eat lower-sodium products, often labeled as "lower sodium" or "no salt added."  Eat fresh foods.  Eat more vegetables, fruits, and low-fat dairy products.  Choose whole grains. Look for the word "whole" as the first word in the ingredient list.  Choose fish and skinless chicken or turkey more often than red meat. Limit fish, poultry, and meat to 6 oz (170 g) each day.  Limit sweets, desserts, sugars, and sugary drinks.  Choose heart-healthy fats.  Limit cheese to 1 oz (28 g) per day.  Eat more home-cooked food and less restaurant, buffet, and fast food.  Limit fried foods.  Cook foods using methods other than frying.  Limit canned vegetables. If you do use them, rinse them well to decrease the sodium.  When eating at a restaurant, ask that your food be prepared with less salt, or no salt if possible.  WHAT FOODS CAN I EAT? Seek help from a dietitian for individual calorie needs.  Grains Whole grain or whole wheat bread. Brown rice. Whole grain or whole wheat pasta. Quinoa, bulgur, and whole grain cereals. Low-sodium cereals. Corn or whole wheat flour tortillas. Whole grain cornbread. Whole grain crackers. Low-sodium crackers.  Vegetables Fresh or frozen  vegetables (raw, steamed, roasted, or grilled). Low-sodium or reduced-sodium tomato and vegetable juices. Low-sodium or reduced-sodium tomato sauce and paste. Low-sodium or reduced-sodium canned vegetables.   Fruits All fresh, canned (in natural juice), or frozen fruits.  Meat and Other Protein Products Ground beef (85% or leaner), grass-fed beef, or beef trimmed of fat. Skinless chicken or turkey. Ground chicken or turkey. Pork trimmed of fat. All fish and seafood. Eggs. Dried beans, peas, or lentils. Unsalted nuts and seeds. Unsalted canned beans.  Dairy Low-fat dairy products, such as skim or 1% milk, 2% or reduced-fat cheeses, low-fat ricotta or cottage cheese, or plain low-fat yogurt. Low-sodium or reduced-sodium cheeses.  Fats and Oils Tub margarines without trans fats. Light or reduced-fat mayonnaise and salad dressings (reduced sodium). Avocado. Safflower, olive, or canola oils. Natural peanut or almond butter.  Other Unsalted popcorn and pretzels. The items listed above may not be a complete list of recommended foods or beverages. Contact your dietitian for more options.  WHAT FOODS ARE NOT RECOMMENDED?  Grains White bread. White pasta. White rice. Refined cornbread. Bagels and croissants. Crackers that contain trans fat.  Vegetables Creamed or fried vegetables. Vegetables in a cheese sauce. Regular canned vegetables. Regular canned tomato sauce and paste. Regular tomato and vegetable juices.  Fruits Dried fruits. Canned fruit in light or heavy syrup. Fruit juice.  Meat and Other Protein Products Fatty cuts of meat. Ribs, chicken wings, bacon, sausage, bologna, salami, chitterlings, fatback, hot dogs, bratwurst, and packaged luncheon meats. Salted nuts and seeds. Canned beans with salt.    Dairy Whole or 2% milk, cream, half-and-half, and cream cheese. Whole-fat or sweetened yogurt. Full-fat cheeses or blue cheese. Nondairy creamers and whipped toppings. Processed cheese,  cheese spreads, or cheese curds.  Condiments Onion and garlic salt, seasoned salt, table salt, and sea salt. Canned and packaged gravies. Worcestershire sauce. Tartar sauce. Barbecue sauce. Teriyaki sauce. Soy sauce, including reduced sodium. Steak sauce. Fish sauce. Oyster sauce. Cocktail sauce. Horseradish. Ketchup and mustard. Meat flavorings and tenderizers. Bouillon cubes. Hot sauce. Tabasco sauce. Marinades. Taco seasonings. Relishes.  Fats and Oils Butter, stick margarine, lard, shortening, ghee, and bacon fat. Coconut, palm kernel, or palm oils. Regular salad dressings.  Other Pickles and olives. Salted popcorn and pretzels.  The items listed above may not be a complete list of foods and beverages to avoid. Contact your dietitian for more information.  WHERE CAN I FIND MORE INFORMATION? National Heart, Lung, and Blood Institute: www.nhlbi.nih.gov/health/health-topics/topics/dash/ Document Released: 09/18/2011 Document Revised: 02/13/2014 Document Reviewed: 08/03/2013 Exit[employer] Patient Information 2015 [employer]. This information is not intended to replace advice given to you by your health care provider. Make sure you discuss any questions you have with your health care provider.   I think that you would greatly benefit from seeing a nutritionist.  If you are interested, please call Dr Sykes at [phone] to schedule an appointment.   

## 2019-11-07 NOTE — Progress Notes (Signed)
Subjective:  Patient ID: Felicia Figueroa, female    DOB: 12/08/1960, 59 y.o.   MRN: [id number]  Patient Care Team: Baruch Gouty, FNP as PCP - General (Family Medicine)   Chief Complaint:  Hypertension (2 week follow up )   HPI: Felicia Figueroa is a 59 y.o. female presenting on 11/07/2019 for Hypertension (2 week follow up )   1. Hypertension associated with diabetes ([place]) Pt following up today after initiation of lisinopril for elevated blood pressure. Pt states she has been taking the medication daily but has not been checking her blood pressure at home. States she is tolerating the medication well. No chest pain, dizziness, shortness of breath, headaches, leg swelling, or confusion.      Relevant past medical, surgical, family, and social history reviewed and updated as indicated.  Allergies and medications reviewed and updated. Date reviewed: Chart in Epic.   Past Medical History:  Diagnosis Date  . Anxiety   . COPD (chronic obstructive pulmonary disease) ([address])   . Depression   . Diabetes mellitus without complication ([place])   . Hypertension     Past Surgical History:  Procedure Laterality Date  . AMPUTATION TOE      Social History   Socioeconomic History  . Marital status: Divorced    Spouse name: Not on file  . Number of children: Not on file  . Years of education: Not on file  . Highest education level: Not on file  Occupational History  . Not on file  Tobacco Use  . Smoking status: Current Every Day Smoker    Packs/day: 1.00    Years: 35.00    Pack years: 35.00    Types: Cigarettes  . Smokeless tobacco: Never Used  Substance and Sexual Activity  . Alcohol use: Not Currently  . Drug use: Never  . Sexual activity: Not on file  Other Topics Concern  . Not on file  Social History Narrative  . Not on file   Social Determinants of Health   Financial Resource Strain:   . Difficulty of Paying Living Expenses: Not on file  Food  Insecurity:   . Worried About Charity fundraiser in the Last Year: Not on file  . Ran Out of Food in the Last Year: Not on file  Transportation Needs:   . Lack of Transportation (Medical): Not on file  . Lack of Transportation (Non-Medical): Not on file  Physical Activity:   . Days of Exercise per Week: Not on file  . Minutes of Exercise per Session: Not on file  Stress:   . Feeling of Stress : Not on file  Social Connections:   . Frequency of Communication with Friends and Family: Not on file  . Frequency of Social Gatherings with Friends and Family: Not on file  . Attends Religious Services: Not on file  . Active Member of Clubs or Organizations: Not on file  . Attends Archivist Meetings: Not on file  . Marital Status: Not on file  Intimate Partner Violence:   . Fear of Current or Ex-Partner: Not on file  . Emotionally Abused: Not on file  . Physically Abused: Not on file  . Sexually Abused: Not on file    Outpatient Encounter Medications as of 11/07/2019  Medication Sig  . Accu-Chek FastClix Lancets MISC Use to check blood sugar 3 daily  . atorvastatin (LIPITOR) 40 MG tablet Take 40 mg by mouth every morning.  . BD VEO INSULIN  SYRINGE U/F 31G X 15/64" 0.3 ML MISC See admin instructions. with insulin  . gabapentin (NEURONTIN) 600 MG tablet Take 600 mg by mouth 2 (two) times daily.   Marland Kitchen glucose blood test strip Use to check blood sugar 3 daily  . insulin NPH-regular Human (HUMULIN 70/30) (70-30) 100 UNIT/ML injection 28 in AM  ( not needing PM dose )  . Insulin Syringe-Needle U-100 31G X 15/64" 0.3 ML MISC USE AS DIRECTED WITH INSULIN  . mupirocin ointment (BACTROBAN) 2 % APPLY TO AFFECTED AREASlON RIGHT GREAT TOE TWICE A DAY  . pantoprazole (PROTONIX) 40 MG tablet TAKE ONE TABLET (40 MG TOTAL) BY MOUTH EVERY MORNING.  . [DISCONTINUED] lisinopril (ZESTRIL) 10 MG tablet Take 1 tablet (10 mg total) by mouth daily.  Marland Kitchen lisinopril-hydrochlorothiazide (ZESTORETIC) 10-12.5 MG  tablet Take 1 tablet by mouth daily.   No facility-administered encounter medications on file as of 11/07/2019.    Allergies  Allergen Reactions  . Cephalexin Other (See Comments)    unknown  . Hydrocodone-Acetaminophen Rash    Itching, vomiting  . Sulfa Antibiotics Rash    Review of Systems  Constitutional: Negative for activity change, appetite change, chills, diaphoresis and fatigue.  HENT: Negative.   Eyes: Negative.  Negative for photophobia and visual disturbance.  Respiratory: Negative for cough, chest tightness and shortness of breath.   Cardiovascular: Negative for chest pain, palpitations and leg swelling.  Gastrointestinal: Negative for abdominal pain, blood in stool, constipation, diarrhea, nausea and vomiting.  Endocrine: Negative.  Negative for polydipsia, polyphagia and polyuria.  Genitourinary: Negative for decreased urine volume, difficulty urinating, dysuria, frequency and urgency.  Musculoskeletal: Negative for arthralgias and myalgias.  Skin: Negative.   Allergic/Immunologic: Negative.   Neurological: Negative for dizziness, tremors, seizures, syncope, speech difficulty, weakness, light-headedness, numbness and headaches.  Hematological: Negative.   Psychiatric/Behavioral: Negative for confusion, hallucinations, sleep disturbance and suicidal ideas.  All other systems reviewed and are negative.       Objective:  BP (!) 160/87 (BP Location: Right Arm, Cuff Size: Normal)   Pulse 89   Temp (!) 97.5 F (36.4 C)   Resp 20   Ht '5\' 5"'$  (1.651 m)   Wt 142 lb (64.4 kg)   SpO2 100%   BMI 23.63 kg/m    Wt Readings from Last 3 Encounters:  11/07/19 142 lb (64.4 kg)  10/21/19 142 lb 12.8 oz (64.8 kg)    Physical Exam Vitals and nursing note reviewed.  Constitutional:      General: She is not in acute distress.    Appearance: Normal appearance. She is well-developed and well-groomed. She is not ill-appearing, toxic-appearing or diaphoretic.  HENT:      Head: Normocephalic and atraumatic.     Jaw: There is normal jaw occlusion.     Right Ear: Hearing normal.     Left Ear: Hearing normal.     Nose: Nose normal.     Mouth/Throat:     Lips: Pink.     Mouth: Mucous membranes are moist.     Pharynx: Oropharynx is clear. Uvula midline.  Eyes:     General: Lids are normal.     Extraocular Movements: Extraocular movements intact.     Conjunctiva/sclera: Conjunctivae normal.     Pupils: Pupils are equal, round, and reactive to light.  Neck:     Thyroid: No thyroid mass, thyromegaly or thyroid tenderness.     Vascular: No carotid bruit or JVD.     Trachea: Trachea and phonation normal.  Cardiovascular:     Rate and Rhythm: Normal rate and regular rhythm.     Chest Wall: PMI is not displaced.     Pulses: Normal pulses.     Heart sounds: Normal heart sounds. No murmur. No friction rub. No gallop.   Pulmonary:     Effort: Pulmonary effort is normal. No respiratory distress.     Breath sounds: Normal breath sounds. No wheezing.  Abdominal:     General: Bowel sounds are normal. There is no distension or abdominal bruit.     Palpations: Abdomen is soft. There is no hepatomegaly or splenomegaly.     Tenderness: There is no abdominal tenderness. There is no right CVA tenderness or left CVA tenderness.     Hernia: No hernia is present.  Musculoskeletal:        General: Normal range of motion.     Cervical back: Normal range of motion and neck supple.     Right lower leg: No edema.     Left lower leg: No edema.  Lymphadenopathy:     Cervical: No cervical adenopathy.  Skin:    General: Skin is warm and dry.     Capillary Refill: Capillary refill takes less than 2 seconds.     Coloration: Skin is not cyanotic, jaundiced or pale.     Findings: No rash.  Neurological:     General: No focal deficit present.     Mental Status: She is alert and oriented to person, place, and time.     Cranial Nerves: Cranial nerves are intact. No cranial nerve  deficit.     Sensory: Sensation is intact. No sensory deficit.     Motor: Motor function is intact. No weakness.     Coordination: Coordination is intact. Coordination normal.     Gait: Gait abnormal (antalgic, postop shoe on right foot).     Deep Tendon Reflexes: Reflexes are normal and symmetric. Reflexes normal.  Psychiatric:        Attention and Perception: Attention and perception normal.        Mood and Affect: Mood and affect normal.        Speech: Speech normal.        Behavior: Behavior normal. Behavior is cooperative.        Thought Content: Thought content normal.        Cognition and Memory: Cognition and memory normal.        Judgment: Judgment normal.     Results for orders placed or performed in visit on 10/21/19  CBC with Differential/Platelet  Result Value Ref Range   WBC 6.7 3.4 - 10.8 x10E3/uL   RBC 4.41 3.77 - 5.28 x10E6/uL   Hemoglobin 13.5 11.1 - 15.9 g/dL   Hematocrit 39.2 34.0 - 46.6 %   MCV 89 79 - 97 fL   MCH 30.6 26.6 - 33.0 pg   MCHC 34.4 31.5 - 35.7 g/dL   RDW 14.0 11.7 - 15.4 %   Platelets 248 150 - 450 x10E3/uL   Neutrophils 53 Not Estab. %   Lymphs 37 Not Estab. %   Monocytes 6 Not Estab. %   Eos 3 Not Estab. %   Basos 1 Not Estab. %   Neutrophils Absolute 3.6 1.4 - 7.0 x10E3/uL   Lymphocytes Absolute 2.5 0.7 - 3.1 x10E3/uL   Monocytes Absolute 0.4 0.1 - 0.9 x10E3/uL   EOS (ABSOLUTE) 0.2 0.0 - 0.4 x10E3/uL   Basophils Absolute 0.1 0.0 - 0.2 x10E3/uL   Immature Granulocytes  0 Not Estab. %   Immature Grans (Abs) 0.0 0.0 - 0.1 x10E3/uL  CMP14+EGFR  Result Value Ref Range   Glucose 210 (H) 65 - 99 mg/dL   BUN 6 6 - 24 mg/dL   Creatinine, Ser 1.03 (H) 0.57 - 1.00 mg/dL   GFR calc non Af Amer 60 >59 mL/min/1.73   GFR calc Af Amer 69 >59 mL/min/1.73   BUN/Creatinine Ratio 6 (L) 9 - 23   Sodium 139 134 - 144 mmol/L   Potassium 4.2 3.5 - 5.2 mmol/L   Chloride 102 96 - 106 mmol/L   CO2 24 20 - 29 mmol/L   Calcium 9.0 8.7 - 10.2 mg/dL   Total  Protein 6.5 6.0 - 8.5 g/dL   Albumin 4.0 3.8 - 4.9 g/dL   Globulin, Total 2.5 1.5 - 4.5 g/dL   Albumin/Globulin Ratio 1.6 1.2 - 2.2   Bilirubin Total <0.2 0.0 - 1.2 mg/dL   Alkaline Phosphatase 118 (H) 39 - 117 IU/L   AST 15 0 - 40 IU/L   ALT 8 0 - 32 IU/L  Lipid panel  Result Value Ref Range   Cholesterol, Total 164 100 - 199 mg/dL   Triglycerides 142 0 - 149 mg/dL   HDL 34 (L) >39 mg/dL   VLDL Cholesterol Cal 25 5 - 40 mg/dL   LDL Chol Calc (NIH) 105 (H) 0 - 99 mg/dL   Chol/HDL Ratio 4.8 (H) 0.0 - 4.4 ratio  Thyroid Panel With TSH  Result Value Ref Range   TSH 2.030 0.450 - 4.500 uIU/mL   T4, Total 8.5 4.5 - 12.0 ug/dL   T3 Uptake Ratio 23 (L) 24 - 39 %   Free Thyroxine Index 2.0 1.2 - 4.9  Microalbumin / creatinine urine ratio  Result Value Ref Range   Creatinine, Urine 19.2 Not Estab. mg/dL   Microalbumin, Urine <3.0 Not Estab. ug/mL   Microalb/Creat Ratio <16 0 - 29 mg/g creat  hgba1c  Result Value Ref Range   HB A1C (BAYER DCA - WAIVED) 7.2 (H) <7.0 %       Pertinent labs & imaging results that were available during my care of the patient were reviewed by me and considered in my medical decision making.  Assessment & Plan:  Damani was seen today for hypertension.  Diagnoses and all orders for this visit:  Hypertension associated with diabetes ([place]) BP poorly controlled. Changes were made in regimen today, added HCTZ to lisinopril. Goal BP is 130/80. Pt aware to report any persistent high or low readings. DASH diet and exercise encouraged. Exercise at least 150 minutes per week and increase as tolerated. Goal BMI > 25. Stress management encouraged. Avoid nicotine and tobacco product use. Avoid excessive alcohol and NSAID's. Avoid more than 2000 mg of sodium daily. Medications as prescribed. Follow up as scheduled.  -     BMP8+EGFR -     lisinopril-hydrochlorothiazide (ZESTORETIC) 10-12.5 MG tablet; Take 1 tablet by mouth daily.     Continue all other maintenance  medications.  Follow up plan: Return in about 3 months (around 02/05/2020), or if symptoms worsen or fail to improve.  Continue healthy lifestyle choices, including diet (rich in fruits, vegetables, and lean proteins, and low in salt and simple carbohydrates) and exercise (at least 30 minutes of moderate physical activity daily).  Educational handout given for DASH diet  The above assessment and management plan was discussed with the patient. The patient verbalized understanding of and has agreed to the management plan.  Patient is aware to call the clinic if they develop any new symptoms or if symptoms persist or worsen. Patient is aware when to return to the clinic for a follow-up visit. Patient educated on when it is appropriate to go to the emergency department.   Monia Pouch, FNP-C [facility] [phone]

## 2019-11-08 LAB — BMP8+EGFR
BUN/Creatinine Ratio: 6 — ABNORMAL LOW (ref 9–23)
BUN: 6 mg/dL (ref 6–24)
CO2: 20 mmol/L (ref 20–29)
Calcium: 9.2 mg/dL (ref 8.7–10.2)
Chloride: 104 mmol/L (ref 96–106)
Creatinine, Ser: 1.04 mg/dL — ABNORMAL HIGH (ref 0.57–1.00)
GFR calc Af Amer: 68 mL/min/{1.73_m2} (ref >59)
GFR calc non Af Amer: 59 mL/min/{1.73_m2} — ABNORMAL LOW (ref >59)
Glucose: 113 mg/dL — ABNORMAL HIGH (ref 65–99)
Potassium: 3.8 mmol/L (ref 3.5–5.2)
Sodium: 141 mmol/L (ref 134–144)

## 2019-11-09 ENCOUNTER — Other Ambulatory Visit: Payer: Self-pay

## 2019-11-09 ENCOUNTER — Ambulatory Visit (INDEPENDENT_AMBULATORY_CARE_PROVIDER_SITE_OTHER): Payer: Medicare Other | Admitting: *Deleted

## 2019-11-09 DIAGNOSIS — Z0000 Encounter for general adult medical examination without abnormal findings: Secondary | ICD-10-CM

## 2019-11-09 NOTE — Patient Instructions (Signed)

## 2019-11-09 NOTE — Progress Notes (Signed)
MEDICARE ANNUAL WELLNESS VISIT  11/09/2019  Telephone Visit Disclaimer This Medicare AWV was conducted by telephone due to national recommendations for restrictions regarding the COVID-19 Pandemic (e.g. social distancing).  I verified, using two identifiers, that I am speaking with Felicia Figueroa or their authorized healthcare agent. I discussed the limitations, risks, security, and privacy concerns of performing an evaluation and management service by telephone and the potential availability of an in-person appointment in the future. The patient expressed understanding and agreed to proceed.   Subjective:  Felicia Figueroa is a 59 y.o. female patient of Rakes, Doralee Albino, FNP who had a Medicare Annual Wellness Visit today via telephone. Felicia Figueroa is Disabled and lives alone in [place]. she has 2 children. she reports that she is socially active and does interact with friends/family regularly. she is minimally physically active and enjoys painting, drawing, cooking, going out to eat and spending time with family.  Patient Care Team: Sonny Masters, FNP as PCP - General (Family Medicine)  Advanced Directives 11/09/2019  Does Patient Have a Medical Advance Directive? No  Would patient like information on creating a medical advance directive? No - Patient declined    Hospital Utilization Over the Past 12 Months: # of hospitalizations or ER visits: 0 # of surgeries: 0  Review of Systems    Patient reports that her overall health is better compared to last year.  History obtained from chart review  Patient Reported Readings (BP, Pulse, CBG, Weight, etc) none  Pain Assessment Pain : 0-10 Pain Score: 4  Pain Type: Other (Comment)(due to toe amputation) Pain Location: Foot Pain Descriptors / Indicators: Burning, Tingling     Current Medications & Allergies (verified) Allergies as of 11/09/2019      Reactions   Cephalexin Other (See Comments)   unknown   Hydrocodone-acetaminophen Rash   Itching, vomiting   Sulfa Antibiotics Rash      Medication List       Accurate as of November 09, 2019  2:31 PM. If you have any questions, ask your nurse or doctor.        Accu-Chek FastClix Lancets Misc Use to check blood sugar 3 daily   atorvastatin 40 MG tablet Commonly known as: LIPITOR Take 40 mg by mouth every morning.   gabapentin 600 MG tablet Commonly known as: NEURONTIN Take 600 mg by mouth 2 (two) times daily.   glucose blood test strip Use to check blood sugar 3 daily   HumuLIN 70/30 (70-30) 100 UNIT/ML injection Generic drug: insulin NPH-regular Human 28 in AM  ( not needing PM dose )   ibuprofen 800 MG tablet Commonly known as: ADVIL Take 800 mg by mouth every 6 (six) hours as needed.   Insulin Syringe-Needle U-100 31G X 15/64" 0.3 ML Misc USE AS DIRECTED WITH INSULIN   BD Veo Insulin Syringe U/F 31G X 15/64" 0.3 ML Misc Generic drug: Insulin Syringe-Needle U-100 See admin instructions. with insulin   lisinopril-hydrochlorothiazide 10-12.5 MG tablet Commonly known as: ZESTORETIC Take 1 tablet by mouth daily.   mupirocin ointment 2 % Commonly known as: BACTROBAN APPLY TO AFFECTED AREASlON RIGHT GREAT TOE TWICE A DAY   pantoprazole 40 MG tablet Commonly known as: PROTONIX TAKE ONE TABLET (40 MG TOTAL) BY MOUTH EVERY MORNING.       History (reviewed): Past Medical History:  Diagnosis Date  . Anxiety   . COPD (chronic obstructive pulmonary disease) ([facility])   . Depression   . Diabetes  mellitus without complication ([facility])   . Hypertension    Past Surgical History:  Procedure Laterality Date  . AMPUTATION TOE    . CARDIAC SURGERY     3 stents   . KIDNEY SURGERY     stents placed   Family History  Problem Relation Age of Onset  . Depression Mother   . Stroke Mother   . COPD Father   . Cirrhosis Father   . Diabetes Sister   . Anxiety disorder Sister   . Tremor Sister    Social History    Socioeconomic History  . Marital status: Divorced    Spouse name: Not on file  . Number of children: 2  . Years of education: 10  . Highest education level: 10th grade  Occupational History  . Occupation: Disabled  Tobacco Use  . Smoking status: Current Every Day Smoker    Packs/day: 1.00    Years: 35.00    Pack years: 35.00    Types: Cigarettes  . Smokeless tobacco: Never Used  Substance and Sexual Activity  . Alcohol use: Not Currently  . Drug use: Never  . Sexual activity: Not Currently  Other Topics Concern  . Not on file  Social History Narrative  . Not on file   Social Determinants of Health   Financial Resource Strain: Low Risk   . Difficulty of Paying Living Expenses: Not hard at all  Food Insecurity: No Food Insecurity  . Worried About Programme researcher, broadcasting/film/video in the Last Year: Never true  . Ran Out of Food in the Last Year: Never true  Transportation Needs: No Transportation Needs  . Lack of Transportation (Medical): No  . Lack of Transportation (Non-Medical): No  Physical Activity: Inactive  . Days of Exercise per Week: 0 days  . Minutes of Exercise per Session: 0 min  Stress: Stress Concern Present  . Feeling of Stress : To some extent  Social Connections: Moderately Isolated  . Frequency of Communication with Friends and Family: More than three times a week  . Frequency of Social Gatherings with Friends and Family: More than three times a week  . Attends Religious Services: Never  . Active Member of Clubs or Organizations: No  . Attends Banker Meetings: Never  . Marital Status: Divorced    Activities of Daily Living In your present state of health, do you have any difficulty performing the following activities: 11/09/2019  Hearing? N  Vision? N  Comment wears OTC glasses for reading-needs to schedule diabetic eye exam  Difficulty concentrating or making decisions? N  Walking or climbing stairs? Y  Comment has trouble with the stairs if  there are a lot  Dressing or bathing? N  Doing errands, shopping? N  Preparing Food and eating ? N  Using the Toilet? N  In the past six months, have you accidently leaked urine? N  Do you have problems with loss of bowel control? N  Managing your Medications? N  Managing your Finances? N  Housekeeping or managing your Housekeeping? N  Some recent data might be hidden    Patient Education/ Literacy How often do you need to have someone help you when you read instructions, pamphlets, or other written materials from your doctor or pharmacy?: 1 - Never What is the last grade level you completed in school?: 10th grade  Exercise Current Exercise Habits: The patient does not participate in regular exercise at present, Exercise limited by: orthopedic condition(s);cardiac condition(s);respiratory conditions(s)  Diet Patient  reports consuming 2 meals a day and 2 snack(s) a day Patient reports that her primary diet is: Regular Patient reports that she does have regular access to food.   Depression Screen PHQ 2/9 Scores 11/09/2019 11/07/2019 11/07/2019 10/21/2019  PHQ - 2 Score 1 1 0 2  PHQ- 9 Score 4 - - 17     Fall Risk Fall Risk  11/09/2019 11/07/2019 10/21/2019  Falls in the past year? 0 0 0     Objective:  Felicia Figueroa seemed alert and oriented and she participated appropriately during our telephone visit.  Blood Pressure Weight BMI  BP Readings from Last 3 Encounters:  11/07/19 (!) 160/87  10/21/19 (!) 146/92   Wt Readings from Last 3 Encounters:  11/07/19 142 lb (64.4 kg)  10/21/19 142 lb 12.8 oz (64.8 kg)   BMI Readings from Last 1 Encounters:  11/07/19 23.63 kg/m    *Unable to obtain current vital signs, weight, and BMI due to telephone visit type  Hearing/Vision  . Felicia Figueroa did not seem to have difficulty with hearing/understanding during the telephone conversation . Reports that she has not had a formal eye exam by an eye care professional within the past  year . Reports that she has not had a formal hearing evaluation within the past year *Unable to fully assess hearing and vision during telephone visit type  Cognitive Function: 6CIT Screen 11/09/2019  What Year? 0 points  What month? 0 points  What time? 0 points  Count back from 20 0 points  Months in reverse 4 points  Repeat phrase 2 points  Total Score 6   (Normal:0-7, Significant for Dysfunction: >8)  Normal Cognitive Function Screening: Yes   Immunization & Health Maintenance Record Immunization History  Administered Date(s) Administered  . Influenza,inj,quad, With Preservative 09/10/2015  . Pneumococcal Conjugate-13 07/22/2018  . Pneumococcal Polysaccharide-23 09/10/2015    Health Maintenance  Topic Date Due  . FOOT EXAM  06/12/1970  . OPHTHALMOLOGY EXAM  06/12/1970  . PAP SMEAR-Modifier  06/12/1981  . COLONOSCOPY  06/12/2010  . INFLUENZA VACCINE  01/11/2020 (Originally 05/14/2019)  . TETANUS/TDAP  10/20/2020 (Originally 06/13/1979)  . Hepatitis C Screening  10/20/2020 (Originally 01-25-1961)  . HIV Screening  10/20/2020 (Originally 06/13/1975)  . HEMOGLOBIN A1C  04/19/2020  . MAMMOGRAM  11/13/2020  . PNEUMOCOCCAL POLYSACCHARIDE VACCINE AGE 56-64 HIGH RISK  Completed       Assessment  This is a routine wellness examination for Felicia Figueroa.  Health Maintenance: Due or Overdue Health Maintenance Due  Topic Date Due  . FOOT EXAM  06/12/1970  . OPHTHALMOLOGY EXAM  06/12/1970  . PAP SMEAR-Modifier  06/12/1981  . COLONOSCOPY  06/12/2010    Felicia Figueroa does not need a referral for Community Assistance: Care Management:   no Social Work:    no Prescription Assistance:  no Nutrition/Diabetes Education:  no   Plan:  Personalized Goals Goals Addressed            This Visit's Progress   . DIET - INCREASE WATER INTAKE       Try to drink 6-8 glasses of water daily      Personalized Health Maintenance & Screening Recommendations   Influenza vaccine Td vaccine Shingles vaccine  Lung Cancer Screening Recommended: yes-declines at this time (Low Dose CT Chest recommended if Age 63-80 years, 30 pack-year currently smoking OR have quit w/in past 15 years) Hepatitis C Screening recommended: no HIV Screening recommended: no  Advanced Directives: Written information was not  prepared per patient's request.  Referrals & Orders No orders of the defined types were placed in this encounter.   Follow-up Plan . Follow-up with Sonny Masters, FNP as planned . Schedule your Diabetic Eye Exam as discussed . Consider Flu, Shingles and TDAP vaccines at your next visit with your PCP   I have personally reviewed and noted the following in the patient's chart:   . Medical and social history . Use of alcohol, tobacco or illicit drugs  . Current medications and supplements . Functional ability and status . Nutritional status . Physical activity . Advanced directives . List of other physicians . Hospitalizations, surgeries, and ER visits in previous 12 months . Vitals . Screenings to include cognitive, depression, and falls . Referrals and appointments  In addition, I have reviewed and discussed with Felicia Figueroa certain preventive protocols, quality metrics, and best practice recommendations. A written personalized care plan for preventive services as well as general preventive health recommendations is available and can be mailed to the patient at her request.      Felicia Diener, LPN  04/27/9677

## 2019-11-15 ENCOUNTER — Encounter: Payer: Self-pay | Admitting: Cardiology

## 2019-11-15 DIAGNOSIS — E785 Hyperlipidemia, unspecified: Secondary | ICD-10-CM | POA: Insufficient documentation

## 2019-11-15 DIAGNOSIS — Z7189 Other specified counseling: Secondary | ICD-10-CM | POA: Insufficient documentation

## 2019-11-15 NOTE — Progress Notes (Signed)
Cardiology Office Note   Date:  11/16/2019   ID:  Felicia, Figueroa 27-Aug-1961, MRN [id number]  PCP:  Sonny Masters, FNP  Cardiologist:   Rollene Rotunda, MD Referring:  Sonny Masters, FNP  No chief complaint on file.     History of Present Illness: Felicia Figueroa is a 59 y.o. female who is referred by Sonny Masters, FNP for evaluation of CAD.    Was able to review care everywhere and find information including a cardiac cath in 2012 done at [facility].  This demonstrated single-vessel coronary artery disease with occlusion of the second obtuse marginal branch.  She had severe hypokinesis of the anterior wall with an ejection fraction of 35%.  Other catheterizations I was able to review include cath in 2010 showed subtotal stenosis of that marginal.  In 2009 with catheterization mentions a stent in the circumflex that had 50% in-stent restenosis.  There is a perfusion study from 2015 but I cannot access this.  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  She has been doing relatively okay although she has pains under her left breast and through to her back.  This is not like the arm pain she had with her previous stent which she thinks was around 2009.  She notices some dyspnea or discomfort if she carries in a lot of groceries with the laundry.  More of this is in the lumbar back.  She is not describing substernal chest pain or neck discomfort.  She has some dyspnea with exertion which is somewhat chronic but she is not having any PND or orthopnea.  Is not having any palpitations, presyncope or syncope.  Has had no weight gain or edema..  Past Medical History:  Diagnosis Date  . Anxiety   . CAD (coronary artery disease)   . COPD (chronic obstructive pulmonary disease) (HCC)   . Depression   . Diabetes (HCC)   . Hypertension   . PVD (peripheral vascular disease) (HCC)     Past Surgical History:  Procedure  Laterality Date  . AMPUTATION TOE    . CARDIAC SURGERY     Stent   . KIDNEY SURGERY     stents placed     Current Outpatient Medications  Medication Sig Dispense Refill  . gabapentin (NEURONTIN) 600 MG tablet Take 600 mg by mouth 2 (two) times daily.     Marland Kitchen ibuprofen (ADVIL) 800 MG tablet Take 800 mg by mouth every 6 (six) hours as needed.    . insulin NPH-regular Human (HUMULIN 70/30) (70-30) 100 UNIT/ML injection 28 in AM  ( not needing PM dose )    . lisinopril-hydrochlorothiazide (ZESTORETIC) 10-12.5 MG tablet Take 1 tablet by mouth daily. 90 tablet 3  . pantoprazole (PROTONIX) 40 MG tablet TAKE ONE TABLET (40 MG TOTAL) BY MOUTH EVERY MORNING.    Marland Kitchen Accu-Chek FastClix Lancets MISC Use to check blood sugar 3 daily    . aspirin EC 81 MG tablet Take 1 tablet (81 mg total) by mouth daily. 90 tablet 3  . atorvastatin (LIPITOR) 80 MG tablet Take 1 tablet (80 mg total) by mouth daily. 90 tablet 3  . BD VEO INSULIN SYRINGE U/F 31G X 15/64" 0.3 ML MISC See admin instructions. with insulin    . glucose blood test strip Use to check blood sugar 3 daily    . Insulin Syringe-Needle U-100 31G X 15/64" 0.3 ML MISC  USE AS DIRECTED WITH INSULIN    . mupirocin ointment (BACTROBAN) 2 % APPLY TO AFFECTED AREASlON RIGHT GREAT TOE TWICE A DAY     No current facility-administered medications for this visit.    Allergies:   Cephalexin, Hydrocodone-acetaminophen, and Sulfa antibiotics    Social History:  The patient  reports that she has been smoking cigarettes. She has a 35.00 pack-year smoking history. She has never used smokeless tobacco. She reports previous alcohol use. She reports that she does not use drugs.   Family History:  The patient's family history includes Anxiety disorder in her sister; COPD in her father; Cirrhosis in her father; Congestive Heart Failure in her sister; Depression in her mother; Diabetes in her sister; Stroke in her mother; Tremor in her sister.    ROS:  Please see the  history of present illness.   Otherwise, review of systems are positive for none.   All other systems are reviewed and negative.    PHYSICAL EXAM: VS:  BP 140/82   Pulse 91   Ht 5\' 5"  (1.651 m)   Wt 143 lb (64.9 kg)   BMI 23.80 kg/m  , BMI Body mass index is 23.8 kg/m. GENERAL:  Well appearing HEENT:  Pupils equal round and reactive, fundi not visualized, oral mucosa unremarkable NECK:  No jugular venous distention, waveform within normal limits, carotid upstroke brisk and symmetric, no bruits, no thyromegaly LYMPHATICS:  No cervical, inguinal adenopathy LUNGS:  Clear to auscultation bilaterally BACK:  No CVA tenderness CHEST:  Unremarkable HEART:  PMI not displaced or sustained,S1 and S2 within normal limits, no S3, no S4, no clicks, no rubs, no murmurs ABD:  Flat, positive bowel sounds normal in frequency in pitch, no bruits, no rebound, no guarding, no midline pulsatile mass, no hepatomegaly, no splenomegaly EXT:  2 plus pulses throughout, no edema, no cyanosis no clubbing, status post right great toe amputation SKIN:  No rashes no nodules NEURO:  Cranial nerves II through XII grossly intact, motor grossly intact throughout PSYCH:  Cognitively intact, oriented to person place and time    EKG:  EKG is ordered today. The ekg ordered today demonstrates sinus rhythm, rate 91, axis within normal limits, intervals within normal limits, nonspecific lateral T wave changes.   Recent Labs: 10/21/2019: ALT 8; Hemoglobin 13.5; Platelets 248; TSH 2.030 11/07/2019: BUN 6; Creatinine, Ser 1.04; Potassium 3.8; Sodium 141    Lipid Panel    Component Value Date/Time   CHOL 164 10/21/2019 1144   TRIG 142 10/21/2019 1144   HDL 34 (L) 10/21/2019 1144   CHOLHDL 4.8 (H) 10/21/2019 1144   LDLCALC 105 (H) 10/21/2019 1144      Wt Readings from Last 3 Encounters:  11/16/19 143 lb (64.9 kg)  11/07/19 142 lb (64.4 kg)  10/21/19 142 lb 12.8 oz (64.8 kg)      Other studies Reviewed:  Additional studies/ records that were reviewed today include: Labs, outside records. Review of the above records demonstrates:  Please see elsewhere in the note.     ASSESSMENT AND PLAN:  CAD:   She does have some discomfort that is atypical.  She has a long history as above.  She needs to be screened with a stress test.  However, she would not be able walk on a treadmill so she will have a [employer].  She needs aggressive risk reduction regardless of these results.  Of note she should be on a baby aspirin.  HTN: Her blood pressure is mildly  elevated but she just had lisinopril HCT added and she has not started it yet.  I agree with this.  DM: A1c was 7.2.  Therapy per Sonny Masters, FNP  DYSLIPIDEMIA:   LDL is 105.  I am going to increase her Lipitor to 80 mg daily.  COVID EDUCATION: We talked about the vaccine.   Current medicines are reviewed at length with the patient today.  The patient does not have concerns regarding medicines.  The following changes have been made:  no change  Labs/ tests ordered today include:   Orders Placed This Encounter  Procedures  . MYOCARDIAL PERFUSION IMAGING  . EKG 12-Lead     Disposition:   FU with me in 4 months.    Signed, Rollene Rotunda, MD  11/16/2019 3:37 PM    [facility]

## 2019-11-16 ENCOUNTER — Encounter: Payer: Self-pay | Admitting: Cardiology

## 2019-11-16 ENCOUNTER — Other Ambulatory Visit: Payer: Self-pay

## 2019-11-16 ENCOUNTER — Encounter: Payer: Self-pay | Admitting: *Deleted

## 2019-11-16 ENCOUNTER — Ambulatory Visit (INDEPENDENT_AMBULATORY_CARE_PROVIDER_SITE_OTHER): Payer: Medicare Other | Admitting: Cardiology

## 2019-11-16 VITALS — BP 140/82 | HR 91 | Ht 65.0 in | Wt 143.0 lb

## 2019-11-16 DIAGNOSIS — Z7189 Other specified counseling: Secondary | ICD-10-CM

## 2019-11-16 DIAGNOSIS — I10 Essential (primary) hypertension: Secondary | ICD-10-CM | POA: Diagnosis not present

## 2019-11-16 DIAGNOSIS — E785 Hyperlipidemia, unspecified: Secondary | ICD-10-CM | POA: Diagnosis not present

## 2019-11-16 DIAGNOSIS — E118 Type 2 diabetes mellitus with unspecified complications: Secondary | ICD-10-CM

## 2019-11-16 DIAGNOSIS — Z794 Long term (current) use of insulin: Secondary | ICD-10-CM

## 2019-11-16 DIAGNOSIS — I2510 Atherosclerotic heart disease of native coronary artery without angina pectoris: Secondary | ICD-10-CM

## 2019-11-16 MED ORDER — ATORVASTATIN CALCIUM 80 MG PO TABS: 80.0000 mg | ORAL_TABLET | Freq: Every day | ORAL | 3 refills | Status: AC

## 2019-11-16 MED ORDER — ASPIRIN EC 81 MG PO TBEC: 81.0000 mg | DELAYED_RELEASE_TABLET | Freq: Every day | ORAL | 3 refills | Status: AC

## 2019-11-16 NOTE — Progress Notes (Signed)
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

## 2019-11-16 NOTE — Patient Instructions (Signed)
Medication Instructions:  Please start Asprin 81 mg a day. Increase your Atorvastatin to 80 mg a day. Continue all other medications as listed.  *If you need a refill on your cardiac medications before your next appointment, please call your pharmacy*  Testing/Procedures: Your physician has requested that you have a lexiscan myoview. For further information please visit [email or web address] Please follow instruction sheet, as given.  You will be contacted to be scheduled for this testing which will be completed at our [facility] office in [place], [place].  Follow-Up: At [facility], you and your health needs are our priority.  As part of our continuing mission to provide you with exceptional heart care, we have created designated Provider Care Teams.  These Care Teams include your primary Cardiologist (physician) and Advanced Practice Providers (APPs -  Physician Assistants and Nurse Practitioners) who all work together to provide you with the care you need, when you need it.  Your next appointment:   4 month(s)  The format for your next appointment:   In Person  Provider:   Rollene Rotunda, MD  Thank you for choosing [facility]!!     Omron blood pressure arm cuff.

## 2019-11-18 ENCOUNTER — Other Ambulatory Visit: Payer: Self-pay

## 2019-11-21 ENCOUNTER — Telehealth: Payer: Self-pay | Admitting: Family Medicine

## 2019-11-21 ENCOUNTER — Other Ambulatory Visit: Payer: Self-pay | Admitting: *Deleted

## 2019-11-21 DIAGNOSIS — R748 Abnormal levels of other serum enzymes: Secondary | ICD-10-CM

## 2019-11-21 DIAGNOSIS — R899 Unspecified abnormal finding in specimens from other organs, systems and tissues: Secondary | ICD-10-CM

## 2019-11-21 NOTE — Telephone Encounter (Signed)
Pt orders placed

## 2019-11-22 ENCOUNTER — Telehealth (HOSPITAL_COMMUNITY): Payer: Self-pay

## 2019-11-22 ENCOUNTER — Other Ambulatory Visit: Payer: Medicare Other

## 2019-11-22 ENCOUNTER — Other Ambulatory Visit: Payer: Self-pay

## 2019-11-22 DIAGNOSIS — R748 Abnormal levels of other serum enzymes: Secondary | ICD-10-CM

## 2019-11-22 DIAGNOSIS — R899 Unspecified abnormal finding in specimens from other organs, systems and tissues: Secondary | ICD-10-CM

## 2019-11-22 LAB — CMP14+EGFR
ALT: 11 IU/L (ref 0–32)
AST: 12 IU/L (ref 0–40)
Albumin/Globulin Ratio: 1.6 (ref 1.2–2.2)
Albumin: 3.9 g/dL (ref 3.8–4.9)
Alkaline Phosphatase: 98 IU/L (ref 39–117)
BUN/Creatinine Ratio: 8 — ABNORMAL LOW (ref 9–23)
BUN: 10 mg/dL (ref 6–24)
Bilirubin Total: 0.2 mg/dL (ref 0.0–1.2)
CO2: 23 mmol/L (ref 20–29)
Calcium: 9.5 mg/dL (ref 8.7–10.2)
Chloride: 87 mmol/L — ABNORMAL LOW (ref 96–106)
Creatinine, Ser: 1.25 mg/dL — ABNORMAL HIGH (ref 0.57–1.00)
GFR calc Af Amer: 54 mL/min/1.73 — ABNORMAL LOW (ref >59)
GFR calc non Af Amer: 47 mL/min/1.73 — ABNORMAL LOW (ref >59)
Globulin, Total: 2.4 g/dL (ref 1.5–4.5)
Glucose: 232 mg/dL — ABNORMAL HIGH (ref 65–99)
Potassium: 3.4 mmol/L — ABNORMAL LOW (ref 3.5–5.2)
Sodium: 126 mmol/L — ABNORMAL LOW (ref 134–144)
Total Protein: 6.3 g/dL (ref 6.0–8.5)

## 2019-11-22 NOTE — Telephone Encounter (Signed)
Encounter complete. 

## 2019-11-23 ENCOUNTER — Other Ambulatory Visit: Payer: Self-pay | Admitting: *Deleted

## 2019-11-23 ENCOUNTER — Telehealth: Payer: Self-pay | Admitting: Family Medicine

## 2019-11-23 ENCOUNTER — Other Ambulatory Visit: Payer: Self-pay

## 2019-11-23 DIAGNOSIS — N289 Disorder of kidney and ureter, unspecified: Secondary | ICD-10-CM

## 2019-11-23 NOTE — Telephone Encounter (Signed)
Patient aware of results and instruction 

## 2019-11-24 ENCOUNTER — Ambulatory Visit (HOSPITAL_COMMUNITY)
Admission: RE | Admit: 2019-11-24 | Payer: Medicare Other | Source: Ambulatory Visit | Attending: Cardiology | Admitting: Cardiology

## 2019-11-28 ENCOUNTER — Other Ambulatory Visit: Payer: Self-pay | Admitting: Family Medicine

## 2019-12-13 ENCOUNTER — Encounter (HOSPITAL_COMMUNITY): Payer: Self-pay | Admitting: Cardiology

## 2019-12-23 ENCOUNTER — Telehealth (HOSPITAL_COMMUNITY): Payer: Self-pay | Admitting: Cardiology

## 2019-12-23 NOTE — Telephone Encounter (Signed)
Just an FYI. We have made several attempts to contact this patient including sending a letter to schedule or reschedule their echocardiogram. We will be removing the patient from the echo WQ.   Thank you Thea Alken   12/13/2019 Mailed letter/[facility]  12/12/19 [facility] to reschedule Nuclear/[facility] 10:32am  12/07/19 [facility] to reschedule Nuclear/[facility] 02:04pm  11/28/19 [facility] for pt to R/S

## 2019-12-26 ENCOUNTER — Other Ambulatory Visit: Payer: Self-pay | Admitting: Family Medicine

## 2019-12-26 ENCOUNTER — Other Ambulatory Visit: Payer: Self-pay

## 2019-12-26 ENCOUNTER — Other Ambulatory Visit: Payer: Medicare Other

## 2019-12-26 DIAGNOSIS — N289 Disorder of kidney and ureter, unspecified: Secondary | ICD-10-CM

## 2019-12-26 DIAGNOSIS — E1142 Type 2 diabetes mellitus with diabetic polyneuropathy: Secondary | ICD-10-CM

## 2019-12-26 DIAGNOSIS — Z794 Long term (current) use of insulin: Secondary | ICD-10-CM

## 2019-12-26 DIAGNOSIS — E118 Type 2 diabetes mellitus with unspecified complications: Secondary | ICD-10-CM

## 2019-12-26 MED ORDER — HUMULIN 70/30 (70-30) 100 UNIT/ML ~~LOC~~ SUSP: SUBCUTANEOUS | 3 refills | Status: DC

## 2019-12-26 MED ORDER — GABAPENTIN 600 MG PO TABS: 600.0000 mg | ORAL_TABLET | Freq: Two times a day (BID) | ORAL | 3 refills | Status: DC

## 2019-12-26 NOTE — Telephone Encounter (Signed)
Please refill medication ,if appropriate.  Office visit 11-07-2019 and lab work in January and February 2021.

## 2019-12-26 NOTE — Telephone Encounter (Signed)
RX for insulin and gabapentin sent.

## 2019-12-26 NOTE — Telephone Encounter (Signed)
Pt aware and voiced understanding. Also advised pt she needs to come in and have her blood drawn to recheck her CMP.

## 2019-12-26 NOTE — Telephone Encounter (Signed)
  Medication Request  12/26/2019  What is the name of the medication? GABAPENTIN, IBUPROFEN, HUMULIN  Have you contacted your pharmacy to request a refill? YES  Which pharmacy would you like this sent to? [facility]   Patient notified that their request is being sent to the clinical staff for review and that they should receive a call once it is complete. If they do not receive a call within 24 hours they can check with their pharmacy or our office.

## 2019-12-27 ENCOUNTER — Encounter: Payer: Self-pay | Admitting: *Deleted

## 2019-12-27 LAB — CMP14+EGFR
ALT: 14 IU/L (ref 0–32)
AST: 15 IU/L (ref 0–40)
Albumin/Globulin Ratio: 2 (ref 1.2–2.2)
Albumin: 4.3 g/dL (ref 3.8–4.9)
Alkaline Phosphatase: 95 IU/L (ref 39–117)
BUN/Creatinine Ratio: 7 — ABNORMAL LOW (ref 9–23)
BUN: 7 mg/dL (ref 6–24)
Bilirubin Total: 0.2 mg/dL (ref 0.0–1.2)
CO2: 21 mmol/L (ref 20–29)
Calcium: 8.9 mg/dL (ref 8.7–10.2)
Chloride: 110 mmol/L — ABNORMAL HIGH (ref 96–106)
Creatinine, Ser: 1.04 mg/dL — ABNORMAL HIGH (ref 0.57–1.00)
GFR calc Af Amer: 68 mL/min/{1.73_m2} (ref >59)
GFR calc non Af Amer: 59 mL/min/{1.73_m2} — ABNORMAL LOW (ref >59)
Globulin, Total: 2.1 g/dL (ref 1.5–4.5)
Glucose: 162 mg/dL — ABNORMAL HIGH (ref 65–99)
Potassium: 4.3 mmol/L (ref 3.5–5.2)
Sodium: 144 mmol/L (ref 134–144)
Total Protein: 6.4 g/dL (ref 6.0–8.5)

## 2020-02-08 ENCOUNTER — Ambulatory Visit (INDEPENDENT_AMBULATORY_CARE_PROVIDER_SITE_OTHER): Payer: Medicare Other | Admitting: Family Medicine

## 2020-02-08 ENCOUNTER — Other Ambulatory Visit: Payer: Self-pay

## 2020-02-08 ENCOUNTER — Encounter: Payer: Medicare Other | Admitting: Family Medicine

## 2020-02-08 ENCOUNTER — Encounter: Payer: Self-pay | Admitting: Family Medicine

## 2020-02-08 VITALS — BP 135/87 | HR 90 | Temp 97.7°F | Ht 65.0 in | Wt 138.6 lb

## 2020-02-08 DIAGNOSIS — F411 Generalized anxiety disorder: Secondary | ICD-10-CM | POA: Diagnosis not present

## 2020-02-08 DIAGNOSIS — E118 Type 2 diabetes mellitus with unspecified complications: Secondary | ICD-10-CM | POA: Diagnosis not present

## 2020-02-08 DIAGNOSIS — G479 Sleep disorder, unspecified: Secondary | ICD-10-CM | POA: Diagnosis not present

## 2020-02-08 DIAGNOSIS — Z794 Long term (current) use of insulin: Secondary | ICD-10-CM

## 2020-02-08 LAB — BAYER DCA HB A1C WAIVED: HB A1C (BAYER DCA - WAIVED): 7.4 % — ABNORMAL HIGH (ref <7.0)

## 2020-02-08 MED ORDER — TRAZODONE HCL 50 MG PO TABS: 50.0000 mg | ORAL_TABLET | Freq: Every evening | ORAL | 2 refills | Status: DC | PRN

## 2020-02-08 MED ORDER — JARDIANCE 10 MG PO TABS: 10.0000 mg | ORAL_TABLET | Freq: Every day | ORAL | 0 refills | Status: DC

## 2020-02-08 MED ORDER — HUMULIN 70/30 (70-30) 100 UNIT/ML ~~LOC~~ SUSP: 14.0000 [IU] | SUBCUTANEOUS | 0 refills | Status: DC

## 2020-02-08 NOTE — Progress Notes (Signed)
Assessment & Plan:  1. Type 2 diabetes mellitus with complication, with long-term current use of insulin (HCC) Lab Results  Component Value Date   HGBA1C 7.4 (H) 02/08/2020   HGBA1C 7.2 (H) 10/21/2019    - Diabetes is not at goal of A1c < 7. - Medications:   Discussed patient with Lottie Dawson, [facility]. Patient is going to decrease Humulin 70/30 to 14 units once daily and start Jardiance 10 mg PO QD. She will follow-up with Almyra Free in 2 weeks.  Jardiance samples given (14 tablets). Lot #: [id number] Exp: Oct 2020 - Home glucose monitoring: encouraged to monitor closely and keep a log - Patient is currently taking a statin. Patient is taking an ACE-inhibitor/ARB.  - Last foot exam: 02/08/2020 - Last diabetic eye exam: unknown - Urine Microalbumin/Creat Ratio: 10/21/2019 - Instruction/counseling given: reminded to get eye exam and discussed diet - Bayer DCA Hb A1c Waived - empagliflozin (JARDIANCE) 10 MG TABS tablet; Take 10 mg by mouth daily before breakfast.  Dispense: 30 tablet; Refill: 0 - insulin NPH-regular Human (HUMULIN 70/30) (70-30) 100 UNIT/ML injection; Inject 14 Units into the skin every morning.  Dispense: 10 mL; Refill: 0 - CMP14+EGFR - Lipid panel  2. Generalized anxiety disorder - Patient wants to address later.   3. Difficulty sleeping - Uncontrolled, patient started patient back on Trazodone.  - traZODone (DESYREL) 50 MG tablet; Take 1-2 tablets (50-100 mg total) by mouth at bedtime as needed for sleep.  Dispense: 30 tablet; Refill: 2   Return in about 2 weeks (around 02/22/2020) for Almyra Free (DM). 4 week f/u with me for sleep/anxiety.    Hendricks Limes, MSN, APRN, FNP-C [facility]  Subjective:    Patient ID: Felicia Figueroa, female    DOB: Apr 13, 1961, 59 y.o.   MRN: [id number]  Patient Care Team: Loman Brooklyn, FNP as PCP - General (Family Medicine) Minus Breeding, MD as PCP - Cardiology (Cardiology)   Chief Complaint:  Chief  Complaint  Patient presents with  . Establish Care    rakes   . Diabetes    1 month follow up  . Anxiety  . trouble sleeping    HPI: Felicia Figueroa is a 60 y.o. female presenting on 02/08/2020 for Establish Care (rakes ), Diabetes (1 month follow up), Anxiety, and trouble sleeping  Diabetes: Patient presents for follow up of diabetes. Known diabetic complications: cardiovascular disease. Medication compliance: patient has been using Humulin 70/30 28 units in the AM and nothing in the evening as she does not feel she needs it. Is she  on ACE inhibitor or angiotensin II receptor blocker? Yes. Is she on a statin? Yes.   Lab Results  Component Value Date   HGBA1C 7.4 (H) 02/08/2020   HGBA1C 7.2 (H) 10/21/2019   Lab Results  Component Value Date   LDLCALC 69 02/08/2020   CREATININE 1.14 (H) 02/08/2020    Anxiety: patient reports Xanax worked well in the past. States she has failed therapy with "everything except Xanax". When I started naming medications she recognized that she previously had taken Zoloft, Prozac, BuSpar, and Paxil.   Depression screen [facility] 2/9 02/08/2020 11/09/2019 11/07/2019  Decreased Interest 3 0 0  Down, Depressed, Hopeless 2 1 1   PHQ - 2 Score 5 1 1   Altered sleeping 2 1 -  Tired, decreased energy 3 1 -  Change in appetite 2 0 -  Feeling bad or failure about yourself  2 0 -  Trouble concentrating 1  1 -  Moving slowly or fidgety/restless 2 0 -  Suicidal thoughts 0 0 -  PHQ-9 Score 17 4 -   GAD 7 : Generalized Anxiety Score 02/08/2020 10/21/2019  Nervous, Anxious, on Edge 3 3  Control/stop worrying 3 3  Worry too much - different things 3 3  Trouble relaxing 3 3  Restless 3 3  Easily annoyed or irritable 3 3  Afraid - awful might happen 2 3  Total GAD 7 Score 20 21  Anxiety Difficulty - Very difficult    Difficulty Sleeping: patient reports she has taken Trazodone in the past which was helpful.    New complaints: None  Social  history:  Relevant past medical, surgical, family and social history reviewed and updated as indicated. Interim medical history since our last visit reviewed.  Allergies and medications reviewed and updated.  DATA REVIEWED: CHART IN EPIC  ROS: Negative unless specifically indicated above in HPI.    Current Outpatient Medications:  .  Accu-Chek FastClix Lancets MISC, Use to check blood sugar 3 daily, Disp: , Rfl:  .  aspirin EC 81 MG tablet, Take 1 tablet (81 mg total) by mouth daily., Disp: 90 tablet, Rfl: 3 .  atorvastatin (LIPITOR) 80 MG tablet, Take 1 tablet (80 mg total) by mouth daily. (Patient taking differently: Take 40 mg by mouth daily. ), Disp: 90 tablet, Rfl: 3 .  BD VEO INSULIN SYRINGE U/F 31G X 15/64" 0.3 ML MISC, See admin instructions. with insulin, Disp: , Rfl:  .  gabapentin (NEURONTIN) 600 MG tablet, Take 1 tablet (600 mg total) by mouth 2 (two) times daily., Disp: 60 tablet, Rfl: 3 .  glucose blood test strip, Use to check blood sugar 3 daily, Disp: , Rfl:  .  insulin NPH-regular Human (HUMULIN 70/30) (70-30) 100 UNIT/ML injection, Inject 14 Units into the skin every morning., Disp: 10 mL, Rfl: 0 .  Insulin Syringe-Needle U-100 31G X 15/64" 0.3 ML MISC, USE AS DIRECTED WITH INSULIN, Disp: , Rfl:  .  lisinopril-hydrochlorothiazide (ZESTORETIC) 10-12.5 MG tablet, Take 1 tablet by mouth daily., Disp: 90 tablet, Rfl: 3 .  mupirocin ointment (BACTROBAN) 2 %, APPLY TO AFFECTED AREASlON RIGHT GREAT TOE TWICE A DAY, Disp: , Rfl:  .  empagliflozin (JARDIANCE) 10 MG TABS tablet, Take 10 mg by mouth daily before breakfast., Disp: 30 tablet, Rfl: 0 .  ibuprofen (ADVIL) 800 MG tablet, Take 1 tablet (800 mg total) by mouth every 6 (six) hours as needed., Disp: 90 tablet, Rfl: 1 .  traZODone (DESYREL) 50 MG tablet, Take 1-2 tablets (50-100 mg total) by mouth at bedtime as needed for sleep., Disp: 30 tablet, Rfl: 2   Allergies  Allergen Reactions  . Cephalexin Other (See Comments)     unknown  . Hydrocodone-Acetaminophen Rash    Itching, vomiting  . Sulfa Antibiotics Rash   Past Medical History:  Diagnosis Date  . Anxiety   . CAD (coronary artery disease)   . COPD (chronic obstructive pulmonary disease) ([place])   . Depression   . Diabetes ([place])   . History of MI (myocardial infarction)   . Hypertension   . PVD (peripheral vascular disease) ([place])     Past Surgical History:  Procedure Laterality Date  . AMPUTATION TOE    . CARDIAC SURGERY     Stent   . KIDNEY SURGERY     stents placed    Social History   Socioeconomic History  . Marital status: Divorced    Spouse name: Not  on file  . Number of children: 2  . Years of education: 10  . Highest education level: 10th grade  Occupational History  . Occupation: Disabled  Tobacco Use  . Smoking status: Current Every Day Smoker    Packs/day: 1.00    Years: 35.00    Pack years: 35.00    Types: Cigarettes  . Smokeless tobacco: Never Used  Substance and Sexual Activity  . Alcohol use: Not Currently  . Drug use: Never  . Sexual activity: Not Currently  Other Topics Concern  . Not on file  Social History Narrative   Lives alone.   Two daughters.    Social Determinants of Health   Financial Resource Strain: Low Risk   . Difficulty of Paying Living Expenses: Not hard at all  Food Insecurity: No Food Insecurity  . Worried About Charity fundraiser in the Last Year: Never true  . Ran Out of Food in the Last Year: Never true  Transportation Needs: No Transportation Needs  . Lack of Transportation (Medical): No  . Lack of Transportation (Non-Medical): No  Physical Activity: Inactive  . Days of Exercise per Week: 0 days  . Minutes of Exercise per Session: 0 min  Stress: Stress Concern Present  . Feeling of Stress : To some extent  Social Connections: Moderately Isolated  . Frequency of Communication with Friends and Family: More than three times a week  . Frequency of Social Gatherings with Friends and  Family: More than three times a week  . Attends Religious Services: Never  . Active Member of Clubs or Organizations: No  . Attends Archivist Meetings: Never  . Marital Status: Divorced  Human resources officer Violence: Not At Risk  . Fear of Current or Ex-Partner: No  . Emotionally Abused: No  . Physically Abused: No  . Sexually Abused: No        Objective:    BP 135/87   Pulse 90   Temp 97.7 F (36.5 C) (Temporal)   Ht 5' 5"  (1.651 m)   Wt 138 lb 9.6 oz (62.9 kg)   SpO2 96%   BMI 23.06 kg/m   Wt Readings from Last 3 Encounters:  02/08/20 138 lb 9.6 oz (62.9 kg)  11/16/19 143 lb (64.9 kg)  11/07/19 142 lb (64.4 kg)    Physical Exam Vitals reviewed.  Constitutional:      General: She is not in acute distress.    Appearance: Normal appearance. She is normal weight. She is not ill-appearing, toxic-appearing or diaphoretic.  HENT:     Head: Normocephalic and atraumatic.  Eyes:     General: No scleral icterus.       Right eye: No discharge.        Left eye: No discharge.     Conjunctiva/sclera: Conjunctivae normal.  Cardiovascular:     Rate and Rhythm: Normal rate and regular rhythm.     Heart sounds: Normal heart sounds. No murmur. No friction rub. No gallop.   Pulmonary:     Effort: Pulmonary effort is normal. No respiratory distress.     Breath sounds: Normal breath sounds. No stridor. No wheezing, rhonchi or rales.  Musculoskeletal:        General: Normal range of motion.     Cervical back: Normal range of motion.  Skin:    General: Skin is warm and dry.     Capillary Refill: Capillary refill takes less than 2 seconds.  Neurological:     General: No focal  deficit present.     Mental Status: She is alert and oriented to person, place, and time. Mental status is at baseline.  Psychiatric:        Mood and Affect: Mood normal.        Behavior: Behavior normal.        Thought Content: Thought content normal.        Judgment: Judgment normal.     Lab  Results  Component Value Date   TSH 2.030 10/21/2019   Lab Results  Component Value Date   WBC 6.7 10/21/2019   HGB 13.5 10/21/2019   HCT 39.2 10/21/2019   MCV 89 10/21/2019   PLT 248 10/21/2019   Lab Results  Component Value Date   NA 136 02/08/2020   K 4.2 02/08/2020   CO2 23 02/08/2020   GLUCOSE 298 (H) 02/08/2020   BUN 6 02/08/2020   CREATININE 1.14 (H) 02/08/2020   BILITOT <0.2 02/08/2020   ALKPHOS 105 02/08/2020   AST 15 02/08/2020   ALT 13 02/08/2020   PROT 6.0 02/08/2020   ALBUMIN 3.7 (L) 02/08/2020   CALCIUM 8.9 02/08/2020   Lab Results  Component Value Date   CHOL 128 02/08/2020   Lab Results  Component Value Date   HDL 25 (L) 02/08/2020   Lab Results  Component Value Date   LDLCALC 69 02/08/2020   Lab Results  Component Value Date   TRIG 203 (H) 02/08/2020   Lab Results  Component Value Date   CHOLHDL 5.1 (H) 02/08/2020   Lab Results  Component Value Date   HGBA1C 7.4 (H) 02/08/2020

## 2020-02-08 NOTE — Patient Instructions (Signed)

## 2020-02-09 ENCOUNTER — Other Ambulatory Visit: Payer: Self-pay

## 2020-02-09 LAB — CMP14+EGFR
ALT: 13 IU/L (ref 0–32)
AST: 15 IU/L (ref 0–40)
Albumin/Globulin Ratio: 1.6 (ref 1.2–2.2)
Albumin: 3.7 g/dL — ABNORMAL LOW (ref 3.8–4.9)
Alkaline Phosphatase: 105 IU/L (ref 39–117)
BUN/Creatinine Ratio: 5 — ABNORMAL LOW (ref 9–23)
BUN: 6 mg/dL (ref 6–24)
Bilirubin Total: <0.2 mg/dL (ref 0.0–1.2)
CO2: 23 mmol/L (ref 20–29)
Calcium: 8.9 mg/dL (ref 8.7–10.2)
Chloride: 101 mmol/L (ref 96–106)
Creatinine, Ser: 1.14 mg/dL — ABNORMAL HIGH (ref 0.57–1.00)
GFR calc Af Amer: 61 mL/min/{1.73_m2} (ref >59)
GFR calc non Af Amer: 53 mL/min/{1.73_m2} — ABNORMAL LOW (ref >59)
Globulin, Total: 2.3 g/dL (ref 1.5–4.5)
Glucose: 298 mg/dL — ABNORMAL HIGH (ref 65–99)
Potassium: 4.2 mmol/L (ref 3.5–5.2)
Sodium: 136 mmol/L (ref 134–144)
Total Protein: 6 g/dL (ref 6.0–8.5)

## 2020-02-09 LAB — LIPID PANEL
Chol/HDL Ratio: 5.1 ratio — ABNORMAL HIGH (ref 0.0–4.4)
Cholesterol, Total: 128 mg/dL (ref 100–199)
HDL: 25 mg/dL — ABNORMAL LOW (ref >39)
LDL Chol Calc (NIH): 69 mg/dL (ref 0–99)
Triglycerides: 203 mg/dL — ABNORMAL HIGH (ref 0–149)
VLDL Cholesterol Cal: 34 mg/dL (ref 5–40)

## 2020-02-09 NOTE — Telephone Encounter (Signed)
Called patient to inform her of recent lab results.  Patient states that she had an office visit with Deliah Boston, NP yesterday and discussed a refill on Ibuprofen 800 mg for her back.  Patient has not had medication filled by our office. She states that she usually takes 1-3 times daily for her back as needed and has been out for 2-3 months. Please review and advise if refill on medication approve.

## 2020-02-10 MED ORDER — IBUPROFEN 800 MG PO TABS: 800.0000 mg | ORAL_TABLET | Freq: Four times a day (QID) | ORAL | 1 refills | Status: DC | PRN

## 2020-02-19 ENCOUNTER — Emergency Department (HOSPITAL_COMMUNITY)
Admission: EM | Admit: 2020-02-19 | Discharge: 2020-02-19 | Disposition: A | Discharge status: Home / Self Care | Payer: Medicare Other | Attending: Emergency Medicine | Admitting: Emergency Medicine

## 2020-02-19 ENCOUNTER — Other Ambulatory Visit: Payer: Self-pay

## 2020-02-19 ENCOUNTER — Encounter (HOSPITAL_COMMUNITY): Payer: Self-pay | Admitting: Emergency Medicine

## 2020-02-19 ENCOUNTER — Emergency Department (HOSPITAL_COMMUNITY): Payer: Medicare Other

## 2020-02-19 DIAGNOSIS — Z7982 Long term (current) use of aspirin: Secondary | ICD-10-CM | POA: Diagnosis not present

## 2020-02-19 DIAGNOSIS — I129 Hypertensive chronic kidney disease with stage 1 through stage 4 chronic kidney disease, or unspecified chronic kidney disease: Secondary | ICD-10-CM | POA: Diagnosis not present

## 2020-02-19 DIAGNOSIS — I25119 Atherosclerotic heart disease of native coronary artery with unspecified angina pectoris: Secondary | ICD-10-CM | POA: Diagnosis not present

## 2020-02-19 DIAGNOSIS — R55 Syncope and collapse: Secondary | ICD-10-CM | POA: Diagnosis not present

## 2020-02-19 DIAGNOSIS — J449 Chronic obstructive pulmonary disease, unspecified: Secondary | ICD-10-CM | POA: Insufficient documentation

## 2020-02-19 DIAGNOSIS — I252 Old myocardial infarction: Secondary | ICD-10-CM | POA: Insufficient documentation

## 2020-02-19 DIAGNOSIS — Z794 Long term (current) use of insulin: Secondary | ICD-10-CM | POA: Insufficient documentation

## 2020-02-19 DIAGNOSIS — R079 Chest pain, unspecified: Secondary | ICD-10-CM

## 2020-02-19 DIAGNOSIS — N179 Acute kidney failure, unspecified: Secondary | ICD-10-CM | POA: Diagnosis not present

## 2020-02-19 DIAGNOSIS — E1122 Type 2 diabetes mellitus with diabetic chronic kidney disease: Secondary | ICD-10-CM | POA: Diagnosis not present

## 2020-02-19 DIAGNOSIS — F17210 Nicotine dependence, cigarettes, uncomplicated: Secondary | ICD-10-CM | POA: Diagnosis not present

## 2020-02-19 DIAGNOSIS — N183 Chronic kidney disease, stage 3 unspecified: Secondary | ICD-10-CM | POA: Diagnosis not present

## 2020-02-19 DIAGNOSIS — Z79899 Other long term (current) drug therapy: Secondary | ICD-10-CM | POA: Diagnosis not present

## 2020-02-19 LAB — URINALYSIS, ROUTINE W REFLEX MICROSCOPIC
Bacteria, UA: NONE SEEN
Bilirubin Urine: NEGATIVE
Glucose, UA: 150 mg/dL — AB
Hgb urine dipstick: NEGATIVE
Ketones, ur: NEGATIVE mg/dL
Nitrite: NEGATIVE
Protein, ur: NEGATIVE mg/dL
Specific Gravity, Urine: 1.009 (ref 1.005–1.030)
pH: 5 (ref 5.0–8.0)

## 2020-02-19 LAB — BASIC METABOLIC PANEL
Anion gap: 9 (ref 5–15)
BUN: 20 mg/dL (ref 6–20)
CO2: 21 mmol/L — ABNORMAL LOW (ref 22–32)
Calcium: 8.6 mg/dL — ABNORMAL LOW (ref 8.9–10.3)
Chloride: 104 mmol/L (ref 98–111)
Creatinine, Ser: 2.2 mg/dL — ABNORMAL HIGH (ref 0.44–1.00)
GFR calc Af Amer: 28 mL/min — ABNORMAL LOW (ref >60)
GFR calc non Af Amer: 24 mL/min — ABNORMAL LOW (ref >60)
Glucose, Bld: 195 mg/dL — ABNORMAL HIGH (ref 70–99)
Potassium: 4.2 mmol/L (ref 3.5–5.1)
Sodium: 134 mmol/L — ABNORMAL LOW (ref 135–145)

## 2020-02-19 LAB — CBC
HCT: 41.3 % (ref 36.0–46.0)
Hemoglobin: 13.7 g/dL (ref 12.0–15.0)
MCH: 31.8 pg (ref 26.0–34.0)
MCHC: 33.2 g/dL (ref 30.0–36.0)
MCV: 95.8 fL (ref 80.0–100.0)
Platelets: 222 10*3/uL (ref 150–400)
RBC: 4.31 MIL/uL (ref 3.87–5.11)
RDW: 12.6 % (ref 11.5–15.5)
WBC: 11.7 10*3/uL — ABNORMAL HIGH (ref 4.0–10.5)
nRBC: 0 % (ref 0.0–0.2)

## 2020-02-19 LAB — TROPONIN I (HIGH SENSITIVITY)
Troponin I (High Sensitivity): 11 ng/L (ref <18)
Troponin I (High Sensitivity): 10 ng/L (ref <18)

## 2020-02-19 MED ORDER — SODIUM CHLORIDE 0.9 % IV BOLUS
1000.0000 mL | Freq: Once | INTRAVENOUS | Status: AC
  Administered 2020-02-19: 1000 mL via INTRAVENOUS

## 2020-02-19 MED ORDER — ASPIRIN 81 MG PO CHEW
324.0000 mg | CHEWABLE_TABLET | Freq: Once | ORAL | Status: AC
  Administered 2020-02-19: 324 mg via ORAL
  Filled 2020-02-19: qty 4

## 2020-02-19 NOTE — ED Provider Notes (Signed)
[facility] EMERGENCY DEPARTMENT Provider Note   CSN: [id number] Arrival date & time: 02/19/20  1701     History Chief Complaint  Patient presents with  . Generalized Pain    Felicia Figueroa is a 59 y.o. female.  She said she was at a luncheon when she acutely had pain in her chest neck that radiated up into her head and caused her to feel lightheaded and short of breath.  Similar to prior heart attack.  EMS found with a low blood pressure.  She says she still having a little pain but it is improved.  Has had this before, not with a heart attack.  She has a history of chronic back issues.  She recently had a couple of her diabetes medicine switched.  The history is provided by the patient.  Chest Pain Pain location:  L chest Pain quality: tightness   Pain radiates to:  Neck Pain severity:  Severe Onset quality:  Sudden Duration:  3 hours Timing:  Constant Progression:  Improving Chronicity:  Recurrent Relieved by:  None tried Worsened by:  Nothing Ineffective treatments:  None tried Associated symptoms: back pain, dizziness, nausea and shortness of breath   Associated symptoms: no abdominal pain, no cough, no diaphoresis, no fever, no lower extremity edema and no vomiting   Risk factors: coronary artery disease, diabetes mellitus and hypertension        Past Medical History:  Diagnosis Date  . Anxiety   . CAD (coronary artery disease)   . COPD (chronic obstructive pulmonary disease) (HCC)   . Depression   . Diabetes (HCC)   . History of MI (myocardial infarction)   . Hypertension   . PVD (peripheral vascular disease) [facility])     Patient Active Problem List   Diagnosis Date Noted  . Dyslipidemia 11/15/2019  . Educated about COVID-19 virus infection 11/15/2019  . Hypertension 10/21/2019  . Amputation of right great toe (HCC) 10/21/2019  . Diabetic polyneuropathy associated with type 2 diabetes mellitus (HCC) 12/22/2017  . Type 2 diabetes  mellitus with complication, with long-term current use of insulin (HCC) 12/22/2017  . Hyperlipidemia due to type 2 diabetes mellitus (HCC) 02/03/2017  . Lumbosacral spondylosis without myelopathy 05/08/2016  . Generalized anxiety disorder 01/21/2016  . Gastroesophageal reflux disease 12/11/2015  . Chronic bilateral low back pain without sciatica 09/10/2015  . Degenerative disc disease at L5-S1 level 09/10/2015  . Simple chronic bronchitis (HCC) 09/10/2015  . Moderate episode of recurrent major depressive disorder (HCC) 06/08/2015  . Chronic pain syndrome 12/06/2014  . CKD stage 3 due to type 2 diabetes mellitus (HCC) 12/06/2014  . Heavy cigarette smoker (20-39 per day) 12/06/2014  . Coronary artery disease involving native coronary artery of native heart without angina pectoris 12/05/2014  . PVD (peripheral vascular disease) with claudication (HCC) 12/05/2014    Past Surgical History:  Procedure Laterality Date  . AMPUTATION TOE    . CARDIAC SURGERY     Stent   . KIDNEY SURGERY     stents placed     OB History   No obstetric history on file.     Family History  Problem Relation Age of Onset  . Depression Mother   . Stroke Mother   . COPD Father   . Cirrhosis Father   . Diabetes Sister   . Anxiety disorder Sister   . Tremor Sister   . Congestive Heart Failure Sister        No details, sudden death  Social History   Tobacco Use  . Smoking status: Current Every Day Smoker    Packs/day: 1.00    Years: 35.00    Pack years: 35.00    Types: Cigarettes  . Smokeless tobacco: Never Used  Substance Use Topics  . Alcohol use: Not Currently  . Drug use: Never    Home Medications Prior to Admission medications   Medication Sig Start Date End Date Taking? Authorizing Provider  Accu-Chek FastClix Lancets MISC Use to check blood sugar 3 daily 03/13/16 11/19/29  [provider]  aspirin EC 81 MG tablet Take 1 tablet (81 mg total) by mouth daily. 11/16/19   Rollene Rotunda, MD  atorvastatin (LIPITOR) 80 MG tablet Take 1 tablet (80 mg total) by mouth daily. Patient taking differently: Take 40 mg by mouth daily.  11/16/19   Rollene Rotunda, MD  BD VEO INSULIN SYRINGE U/F 31G X 15/64" 0.3 ML MISC See admin instructions. with insulin 09/23/19   [provider]  empagliflozin (JARDIANCE) 10 MG TABS tablet Take 10 mg by mouth daily before breakfast. 02/08/20   Gwenlyn Fudge, FNP  gabapentin (NEURONTIN) 600 MG tablet Take 1 tablet (600 mg total) by mouth 2 (two) times daily. 12/26/19 12/25/20  Sonny Masters, FNP  glucose blood test strip Use to check blood sugar 3 daily 03/20/16 11/25/29  [provider]  ibuprofen (ADVIL) 800 MG tablet Take 1 tablet (800 mg total) by mouth every 6 (six) hours as needed. 02/10/20   Gwenlyn Fudge, FNP  insulin NPH-regular Human (HUMULIN 70/30) (70-30) 100 UNIT/ML injection Inject 14 Units into the skin every morning. 02/08/20   Gwenlyn Fudge, FNP  Insulin Syringe-Needle U-100 31G X 15/64" 0.3 ML MISC USE AS DIRECTED WITH INSULIN 03/16/19   [provider]  lisinopril-hydrochlorothiazide (ZESTORETIC) 10-12.5 MG tablet Take 1 tablet by mouth daily. 11/07/19   Sonny Masters, FNP  mupirocin ointment (BACTROBAN) 2 % APPLY TO AFFECTED AREASlON RIGHT GREAT TOE TWICE A DAY 10/04/19   [provider]  traZODone (DESYREL) 50 MG tablet Take 1-2 tablets (50-100 mg total) by mouth at bedtime as needed for sleep. 02/08/20   Gwenlyn Fudge, FNP    Allergies    Cephalexin, Hydrocodone-acetaminophen, and Sulfa antibiotics  Review of Systems   Review of Systems  Constitutional: Negative for diaphoresis and fever.  HENT: Negative for sore throat.   Eyes: Negative for visual disturbance.  Respiratory: Positive for shortness of breath. Negative for cough.   Cardiovascular: Positive for chest pain.  Gastrointestinal: Positive for nausea. Negative for abdominal pain and vomiting.  Genitourinary: Negative for  dysuria.  Musculoskeletal: Positive for back pain.  Skin: Negative for rash.  Neurological: Positive for dizziness.    Physical Exam Updated Vital Signs BP (!) 91/59 (BP Location: Right Arm)   Pulse 92   Temp 98 F (36.7 C) (Oral)   Resp 15   SpO2 100%   Physical Exam Vitals and nursing note reviewed.  Constitutional:      General: She is not in acute distress.    Appearance: Normal appearance. She is well-developed.  HENT:     Head: Normocephalic and atraumatic.  Eyes:     Conjunctiva/sclera: Conjunctivae normal.  Cardiovascular:     Rate and Rhythm: Normal rate and regular rhythm.     Heart sounds: No murmur.  Pulmonary:     Effort: Pulmonary effort is normal. No respiratory distress.     Breath sounds: Normal breath sounds.  Abdominal:  Palpations: Abdomen is soft.     Tenderness: There is no abdominal tenderness.  Musculoskeletal:        General: No deformity or signs of injury. Normal range of motion.     Cervical back: Neck supple.  Skin:    General: Skin is warm and dry.     Capillary Refill: Capillary refill takes less than 2 seconds.  Neurological:     General: No focal deficit present.     Mental Status: She is alert and oriented to person, place, and time.     Sensory: No sensory deficit.     Motor: No weakness.     ED Results / Procedures / Treatments   Labs (all labs ordered are listed, but only abnormal results are displayed) Labs Reviewed  BASIC METABOLIC PANEL - Abnormal; Notable for the following components:      Result Value   Sodium 134 (*)    CO2 21 (*)    Glucose, Bld 195 (*)    Creatinine, Ser 2.20 (*)    Calcium 8.6 (*)    GFR calc non Af Amer 24 (*)    GFR calc Af Amer 28 (*)    All other components within normal limits  CBC - Abnormal; Notable for the following components:   WBC 11.7 (*)    All other components within normal limits  URINALYSIS, ROUTINE W REFLEX MICROSCOPIC - Abnormal; Notable for the following components:    APPearance HAZY (*)    Glucose, UA 150 (*)    Leukocytes,Ua SMALL (*)    All other components within normal limits  TROPONIN I (HIGH SENSITIVITY)  TROPONIN I (HIGH SENSITIVITY)    EKG EKG Interpretation  Date/Time:  Sunday Feb 19 2020 17:55:25 EDT Ventricular Rate:  84 PR Interval:    QRS Duration: 117 QT Interval:  417 QTC Calculation: 493 R Axis:   -17 Text Interpretation: Sinus rhythm Nonspecific intraventricular conduction delay Abnormal inferior Q waves No old tracing to compare Confirmed by Aletta Edouard [phone]) on 02/19/2020 6:05:23 PM   Radiology DG Chest Port 1 View  Result Date: 02/19/2020 CLINICAL DATA:  Diffuse pain. EXAM: PORTABLE CHEST 1 VIEW COMPARISON:  None. FINDINGS: The heart size and mediastinal contours are within normal limits. Both lungs are clear. The visualized skeletal structures are unremarkable. IMPRESSION: No active disease. Electronically Signed   By: Virgina Norfolk M.D.   On: 02/19/2020 18:03    Procedures Procedures (including critical care time)  Medications Ordered in ED Medications  aspirin chewable tablet 324 mg (324 mg Oral Given 02/19/20 1806)  sodium chloride 0.9 % bolus 1,000 mL (0 mLs Intravenous Stopped 02/19/20 1952)  sodium chloride 0.9 % bolus 1,000 mL (0 mLs Intravenous Stopped 02/19/20 2229)    ED Course  I have reviewed the triage vital signs and the nursing notes.  Pertinent labs & imaging results that were available during my care of the patient were reviewed by me and considered in my medical decision making (see chart for details).  Clinical Course as of Feb 19 1033  Sun Feb 19, 2020  1949 Patient's labs significant for an elevated creatinine of 2.2 up from her baseline of 1.1 chest 11 days ago.  She said she is drinking plenty.  After first liter fluid her pressures are back up in the 130s and she feels better.  Initial troponin XI waiting on second troponin.   [MB]  2114 Patient was up and ambulated in the department to  go give a  urine sample.  Dizziness and pain much improved.   [MB]    Clinical Course User Index [MB] Terrilee Files, MD   MDM Rules/Calculators/A&P                     This patient complains of presyncope lightheadedness chest pain; this involves an extensive number of treatment Options and is a complaint that carries with it a high risk of complications and Morbidity. The differential includes ACS, PE, pneumothorax on the musculoskeletal, arrhythmia, dehydration, metabolic derangement, anemia  I ordered, reviewed and interpreted labs, which included CBC showing a mildly elevated white count normal hemoglobin.  Chemistry showing low bicarb and elevated creatinine consistent with dehydration.  Urinalysis 6-10 whites but also with squames and no symptoms so likely contaminant.  Delta troponin unchanged I ordered medication aspirin and IV fluid boluses I ordered imaging studies which included chest x-ray and I independently    visualized and interpreted imaging which showed no gross infiltrates or pneumothorax Additional history obtained from patient's daughter Previous records obtained and reviewed in epic After the interventions stated above, I reevaluated the patient and found the patient's blood pressure to be improved and is now asymptomatic.  We discussed that she will need to continue to hydrate and follow-up with her primary care doctor for repeat blood work to make sure her renal function trends in the right direction.  Return instructions discussed.   Final Clinical Impression(s) / ED Diagnoses Final diagnoses:  Near syncope  Nonspecific chest pain  AKI (acute kidney injury) [facility])    Rx / DC Orders ED Discharge Orders    None       Terrilee Files, MD 02/20/20 1037

## 2020-02-19 NOTE — ED Triage Notes (Signed)
Pt BIB [facility] from home. Pt reportedly at a function when she began having pain all over. Pt reports being prescribed two new medications a few weeks ago. Pt stopped taking medications and then took some today and reports pain following medications. Pt was hypotensive upon EMS arrival. Pt A&Ox4. NAD. VSS.

## 2020-02-19 NOTE — Discharge Instructions (Signed)
You were seen in the emergency department after feeling faint lightheaded with chest pain. You had blood work chest x-ray EKG urinalysis. The most significant finding is that your kidney renal function was worsened likely due to dehydration. Your symptoms improved after getting IV fluids and your blood pressure also rose back to a normal range. Please continue to stay well-hydrated and follow-up with your primary care doctor to get some repeat blood work to make sure that it continues to improve. Return to the emergency department if any worsening or concerning symptoms

## 2020-02-22 ENCOUNTER — Ambulatory Visit (INDEPENDENT_AMBULATORY_CARE_PROVIDER_SITE_OTHER): Payer: Medicare Other | Admitting: Pharmacist

## 2020-02-22 ENCOUNTER — Other Ambulatory Visit: Payer: Self-pay

## 2020-02-22 VITALS — BP 135/86 | HR 52

## 2020-02-22 DIAGNOSIS — E118 Type 2 diabetes mellitus with unspecified complications: Secondary | ICD-10-CM

## 2020-02-22 DIAGNOSIS — N183 Chronic kidney disease, stage 3 unspecified: Secondary | ICD-10-CM

## 2020-02-22 DIAGNOSIS — E1122 Type 2 diabetes mellitus with diabetic chronic kidney disease: Secondary | ICD-10-CM

## 2020-02-22 DIAGNOSIS — Z794 Long term (current) use of insulin: Secondary | ICD-10-CM

## 2020-02-22 MED ORDER — ONETOUCH VERIO REFLECT W/DEVICE KIT: PACK | 1 refills | Status: DC

## 2020-02-22 MED ORDER — GLUCOSE BLOOD VI STRP: ORAL_STRIP | 12 refills | Status: DC

## 2020-02-22 NOTE — Progress Notes (Addendum)
l    02/22/2020 Name: Felicia Figueroa MRN: [id number] DOB: Apr 27, 1961  Referred by: Gwenlyn Fudge, FNP Reason for referral : Diabetes  S:  1 yoF presents for diabetes evaluation, education, and management.  Patient was referred and last seen by Primary Care Provider on 11/07/19  She was recently in the ED for chest pain, AKI, near syncope.  She was reports she was started on insulin 1 year ago in the hospital and never taken off despite A1c in the 7s. She would like to try different PO medications.   Insurance coverage/medication affordability: [employer] medicare   Patient MOSTLY adherent with medications.  Current diabetes medications include: Jardiance samples, insulin NPH 14 units once daily  Current hypertension medications include: lisinopril/HCTZ  Current hyperlipidemia medications include: atorvastatin 80mg   Patient denies hypoglycemic events. Patient-reported exercise habits: n/a  O:   Lab Results  Component Value Date   HGBA1C 7.4 (H) 02/08/2020    Vitals:   02/28/20 0817  BP: 135/86  Pulse: (!) 52     Lipid Panel     Component Value Date/Time   CHOL 128 02/08/2020 1154   TRIG 203 (H) 02/08/2020 1154   HDL 25 (L) 02/08/2020 1154   CHOLHDL 5.1 (H) 02/08/2020 1154   LDLCALC 69 02/08/2020 1154   Home fasting blood sugars: 150-180  2 hour post-meal/random blood sugars: n/a.  A/P:  Diabetes T2DM, A1c 7.4%. Patient is MOSTLY adherent with medications. Control is suboptimal due to diet/lifestyle & current DM medication regimen.  -STOPPED insulin  -START glyxambi (SGLT2+DPP4)  -Samples given EXP 5/22, 6/22  -watch renal fcn GFR 60s-->28, counseled patient to stay hydrated, repeat labs in 04/07/20  -35 pack year smoker, no formal DX of COPD, unable to complete PFTs d/t COVID, smoking cessation counseling provided, patient having difficulty breathing and completing ADL's,   Started Anoro Ellipta for daily maintained inhaler LOT SD2W, EXP  5/22  -HOLD ACEi until repeat BMP (patient has not been taking)  -Patient has self-discontinued statin due to ED episode, will continue to reinforce-->offered alternative statin and therapies--pt to think about and decide by next PCP visit on 5/26  -Medications reconciled s/p ED visit   -Extensively discussed pathophysiology of diabetes, recommended lifestyle interventions, dietary effects on blood sugar control  -Counseled on s/sx of and management of hypoglycemia  -Next A1C anticipated July 2021  Written patient instructions provided.  Total time in face to face counseling 28 minutes.   Follow up PCP on 03/07/20 Clinic Visit for 4-wk f/u    03/09/20, PharmD, BCPS Clinical Pharmacist, [facility]  II Phone [phone]

## 2020-02-28 ENCOUNTER — Encounter: Payer: Self-pay | Admitting: Pharmacist

## 2020-02-28 NOTE — Addendum Note (Signed)
Addended by: Vanice Sarah D on: 02/28/2020 12:16 PM   Modules accepted: Orders

## 2020-03-07 ENCOUNTER — Encounter: Payer: Self-pay | Admitting: Family Medicine

## 2020-03-07 ENCOUNTER — Ambulatory Visit: Payer: Medicare Other | Admitting: Family Medicine

## 2020-03-23 ENCOUNTER — Ambulatory Visit (INDEPENDENT_AMBULATORY_CARE_PROVIDER_SITE_OTHER): Payer: Medicare Other | Admitting: Family Medicine

## 2020-03-23 ENCOUNTER — Encounter: Payer: Self-pay | Admitting: Family Medicine

## 2020-03-23 ENCOUNTER — Other Ambulatory Visit: Payer: Self-pay

## 2020-03-23 VITALS — BP 139/91 | HR 89 | Temp 97.0°F | Ht 65.0 in | Wt 135.0 lb

## 2020-03-23 DIAGNOSIS — E118 Type 2 diabetes mellitus with unspecified complications: Secondary | ICD-10-CM | POA: Diagnosis not present

## 2020-03-23 DIAGNOSIS — Z720 Tobacco use: Secondary | ICD-10-CM

## 2020-03-23 DIAGNOSIS — F411 Generalized anxiety disorder: Secondary | ICD-10-CM

## 2020-03-23 DIAGNOSIS — F331 Major depressive disorder, recurrent, moderate: Secondary | ICD-10-CM

## 2020-03-23 DIAGNOSIS — R0602 Shortness of breath: Secondary | ICD-10-CM

## 2020-03-23 DIAGNOSIS — G479 Sleep disorder, unspecified: Secondary | ICD-10-CM | POA: Diagnosis not present

## 2020-03-23 DIAGNOSIS — Z794 Long term (current) use of insulin: Secondary | ICD-10-CM

## 2020-03-23 MED ORDER — GLYXAMBI 10-5 MG PO TABS: 1.0000 | ORAL_TABLET | Freq: Every day | ORAL | 2 refills | Status: DC

## 2020-03-23 MED ORDER — DOXEPIN HCL 10 MG PO CAPS: 10.0000 mg | ORAL_CAPSULE | Freq: Every day | ORAL | 2 refills | Status: DC

## 2020-03-23 MED ORDER — ALBUTEROL SULFATE HFA 108 (90 BASE) MCG/ACT IN AERS: 2.0000 | INHALATION_SPRAY | Freq: Four times a day (QID) | RESPIRATORY_TRACT | 2 refills | Status: AC | PRN

## 2020-03-23 MED ORDER — ONETOUCH ULTRASOFT LANCETS MISC: 1.0000 | Freq: Two times a day (BID) | 2 refills | Status: DC

## 2020-03-23 NOTE — Patient Instructions (Signed)
Start Doxepin at bedtime to help with sleep, anxiety, and depression. Start with 10 mg for the first week. You may increase to 20 mg if needed after the first week.  Use Anoro inhaler once daily.  Use Albuterol inhaler as needed for wheezing, shortness of breath, and/or tightness in the chest.

## 2020-03-23 NOTE — Progress Notes (Signed)
Assessment & Plan:  1. Difficulty sleeping - Uncontrolled. Started patient on Doxepin with directions to take 10 mg for the first week. She may increase to 20 mg after the first week if needed.  - doxepin (SINEQUAN) 10 MG capsule; Take 1-2 capsules (10-20 mg total) by mouth at bedtime.  Dispense: 30 capsule; Refill: 2  2. Generalized anxiety disorder - Uncontrolled. Started Doxepin.  - doxepin (SINEQUAN) 10 MG capsule; Take 1-2 capsules (10-20 mg total) by mouth at bedtime.  Dispense: 30 capsule; Refill: 2  3. Moderate episode of recurrent major depressive disorder (HCC) - Uncontrolled. Started Doxepin.  - doxepin (SINEQUAN) 10 MG capsule; Take 1-2 capsules (10-20 mg total) by mouth at bedtime.  Dispense: 30 capsule; Refill: 2  4. Type 2 diabetes mellitus with complication, with long-term current use of insulin (HCC) - Lancets (ONETOUCH ULTRASOFT) lancets; 1 each by Other route 2 (two) times daily. Use as instructed  Dispense: 100 each; Refill: 2 - Empagliflozin-linaGLIPtin (GLYXAMBI) 10-5 MG TABS; Take 1 tablet by mouth daily.  Dispense: 30 tablet; Refill: 2  5-6. Tobacco use/Shortness of breath - Encouraged to use Anoro QD and Albuterol PRN.  - albuterol (VENTOLIN HFA) 108 (90 Base) MCG/ACT inhaler; Inhale 2 puffs into the lungs every 6 (six) hours as needed for wheezing or shortness of breath.  Dispense: 18 g; Refill: 2   Return in about 2 months (around 05/23/2020) for DM, sleep, mood, breathing.  Hendricks Limes, MSN, APRN, FNP-C [facility]  Subjective:    Patient ID: Felicia Figueroa, female    DOB: 06-11-1961, 59 y.o.   MRN: [id number]  Patient Care Team: Loman Brooklyn, FNP as PCP - General (Family Medicine) Minus Breeding, MD as PCP - Cardiology (Cardiology) Lavera Guise, [facility] (Pharmacist)   Chief Complaint:  Chief Complaint  Patient presents with  . Diabetes    4 week follow up.  Patient states that her BS has still been running side  at times but it is better than it was.    HPI: Felicia Figueroa is a 59 y.o. female presenting on 03/23/2020 for Diabetes (4 week follow up.  Patient states that her BS has still been running side at times but it is better than it was.)  Patient is following up on difficulty sleeping. She was restarted on Trazodone 50-100 mg in April. She reports she has been taking the 100 mg and it has not helped her at all.   She is requesting medication to treat anxiety and depression. States she has failed therapy with "everything except Xanax" including Zoloft, Prozac, BuSpar, and Paxil.    Patient was prescribed a Anoro by the clinical pharmacist but has only been using it as needed. She does not have an Albuterol inhaler.  She is in need of refills of lancets and Glyxambi.    Social history:  Relevant past medical, surgical, family and social history reviewed and updated as indicated. Interim medical history since our last visit reviewed.  Allergies and medications reviewed and updated.  DATA REVIEWED: CHART IN EPIC  ROS: Negative unless specifically indicated above in HPI.    Current Outpatient Medications:  .  Accu-Chek FastClix Lancets MISC, Use to check blood sugar 3 daily, Disp: , Rfl:  .  aspirin EC 81 MG tablet, Take 1 tablet (81 mg total) by mouth daily., Disp: 90 tablet, Rfl: 3 .  atorvastatin (LIPITOR) 80 MG tablet, Take 1 tablet (80 mg total) by mouth daily., Disp: 90 tablet,  Rfl: 3 .  BD VEO INSULIN SYRINGE U/F 31G X 15/64" 0.3 ML MISC, See admin instructions. with insulin, Disp: , Rfl:  .  Blood Glucose Monitoring Suppl (ONETOUCH VERIO REFLECT) w/Device KIT, Use test blood sugar daily in the morning, Disp: 1 kit, Rfl: 1 .  Empagliflozin-linaGLIPtin (GLYXAMBI) 10-5 MG TABS, Take 1 tablet by mouth daily., Disp: 30 tablet, Rfl: 2 .  gabapentin (NEURONTIN) 600 MG tablet, Take 1 tablet (600 mg total) by mouth 2 (two) times daily. (Patient taking differently: Take 600 mg by mouth 2  (two) times daily as needed (fibromyalgia). ), Disp: 60 tablet, Rfl: 3 .  glucose blood test strip, Use as instructed to test blood sugars daily.  One touch verio test strips, Disp: 100 each, Rfl: 12 .  ibuprofen (ADVIL) 800 MG tablet, Take 1 tablet (800 mg total) by mouth every 6 (six) hours as needed. (Patient taking differently: Take 800 mg by mouth See admin instructions. Take one tablet (800 mg) by mouth every morning, may also take one tablet (800 mg) at night as needed for pain), Disp: 90 tablet, Rfl: 1 .  Insulin Syringe-Needle U-100 31G X 15/64" 0.3 ML MISC, USE AS DIRECTED WITH INSULIN, Disp: , Rfl:  .  lisinopril-hydrochlorothiazide (ZESTORETIC) 10-12.5 MG tablet, Take 1 tablet by mouth daily., Disp: 90 tablet, Rfl: 3 .  mupirocin ointment (BACTROBAN) 2 %, Apply 1 application topically every other day. Apply to right big toe, Disp: , Rfl:  .  umeclidinium-vilanterol (ANORO ELLIPTA) 62.5-25 MCG/INH AEPB, Inhale 1 puff into the lungs daily. Sample given  LOT SD2W, EXP 5/22, Disp: , Rfl:  .  albuterol (VENTOLIN HFA) 108 (90 Base) MCG/ACT inhaler, Inhale 2 puffs into the lungs every 6 (six) hours as needed for wheezing or shortness of breath., Disp: 18 g, Rfl: 2 .  doxepin (SINEQUAN) 10 MG capsule, Take 1-2 capsules (10-20 mg total) by mouth at bedtime., Disp: 30 capsule, Rfl: 2 .  Lancets (ONETOUCH ULTRASOFT) lancets, 1 each by Other route 2 (two) times daily. Use as instructed, Disp: 100 each, Rfl: 2   Allergies  Allergen Reactions  . Cephalexin Nausea And Vomiting  . Hydrocodone-Acetaminophen Itching, Nausea And Vomiting and Rash  . Sulfa Antibiotics Rash   Past Medical History:  Diagnosis Date  . Anxiety   . CAD (coronary artery disease)   . COPD (chronic obstructive pulmonary disease) ([place])   . Depression   . Diabetes ([place])   . History of MI (myocardial infarction)   . Hypertension   . PVD (peripheral vascular disease) ([place])     Past Surgical History:  Procedure Laterality  Date  . AMPUTATION TOE    . CARDIAC SURGERY     Stent   . KIDNEY SURGERY     stents placed    Social History   Socioeconomic History  . Marital status: Divorced    Spouse name: Not on file  . Number of children: 2  . Years of education: 10  . Highest education level: 10th grade  Occupational History  . Occupation: Disabled  Tobacco Use  . Smoking status: Current Every Day Smoker    Packs/day: 1.00    Years: 35.00    Pack years: 35.00    Types: Cigarettes  . Smokeless tobacco: Never Used  Vaping Use  . Vaping Use: Never used  Substance and Sexual Activity  . Alcohol use: Not Currently  . Drug use: Never  . Sexual activity: Not Currently  Other Topics Concern  . Not  on file  Social History Narrative   Lives alone.   Two daughters.    Social Determinants of Health   Financial Resource Strain: Low Risk   . Difficulty of Paying Living Expenses: Not hard at all  Food Insecurity: No Food Insecurity  . Worried About Charity fundraiser in the Last Year: Never true  . Ran Out of Food in the Last Year: Never true  Transportation Needs: No Transportation Needs  . Lack of Transportation (Medical): No  . Lack of Transportation (Non-Medical): No  Physical Activity: Inactive  . Days of Exercise per Week: 0 days  . Minutes of Exercise per Session: 0 min  Stress: Stress Concern Present  . Feeling of Stress : To some extent  Social Connections: Socially Isolated  . Frequency of Communication with Friends and Family: More than three times a week  . Frequency of Social Gatherings with Friends and Family: More than three times a week  . Attends Religious Services: Never  . Active Member of Clubs or Organizations: No  . Attends Archivist Meetings: Never  . Marital Status: Divorced  Human resources officer Violence: Not At Risk  . Fear of Current or Ex-Partner: No  . Emotionally Abused: No  . Physically Abused: No  . Sexually Abused: No        Objective:    BP (!)  139/91   Pulse 89   Temp (!) 97 F (36.1 C) (Temporal)   Ht _0  (1.651 m)   Wt 135 lb (61.2 kg)   SpO2 98%   BMI 22.47 kg/m   Wt Readings from Last 3 Encounters:  03/23/20 135 lb (61.2 kg)  02/08/20 138 lb 9.6 oz (62.9 kg)  11/16/19 143 lb (64.9 kg)    Physical Exam Vitals reviewed.  Constitutional:      General: She is not in acute distress.    Appearance: Normal appearance. She is normal weight. She is not ill-appearing, toxic-appearing or diaphoretic.  HENT:     Head: Normocephalic and atraumatic.  Eyes:     General: No scleral icterus.       Right eye: No discharge.        Left eye: No discharge.     Conjunctiva/sclera: Conjunctivae normal.  Cardiovascular:     Rate and Rhythm: Normal rate and regular rhythm.     Heart sounds: Normal heart sounds. No murmur heard.  No friction rub. No gallop.   Pulmonary:     Effort: Pulmonary effort is normal. No respiratory distress.     Breath sounds: Normal breath sounds. No stridor. No wheezing, rhonchi or rales.  Musculoskeletal:        General: Normal range of motion.     Cervical back: Normal range of motion.  Skin:    General: Skin is warm and dry.     Capillary Refill: Capillary refill takes less than 2 seconds.  Neurological:     General: No focal deficit present.     Mental Status: She is alert and oriented to person, place, and time. Mental status is at baseline.  Psychiatric:        Mood and Affect: Mood normal.        Behavior: Behavior normal.        Thought Content: Thought content normal.        Judgment: Judgment normal.     Lab Results  Component Value Date   TSH 2.030 10/21/2019   Lab Results  Component Value Date  WBC 11.7 (H) 02/19/2020   HGB 13.7 02/19/2020   HCT 41.3 02/19/2020   MCV 95.8 02/19/2020   PLT 222 02/19/2020   Lab Results  Component Value Date   NA 134 (L) 02/19/2020   K 4.2 02/19/2020   CO2 21 (L) 02/19/2020   GLUCOSE 195 (H) 02/19/2020   BUN 20 02/19/2020   CREATININE  2.20 (H) 02/19/2020   BILITOT <0.2 02/08/2020   ALKPHOS 105 02/08/2020   AST 15 02/08/2020   ALT 13 02/08/2020   PROT 6.0 02/08/2020   ALBUMIN 3.7 (L) 02/08/2020   CALCIUM 8.6 (L) 02/19/2020   ANIONGAP 9 02/19/2020   Lab Results  Component Value Date   CHOL 128 02/08/2020   Lab Results  Component Value Date   HDL 25 (L) 02/08/2020   Lab Results  Component Value Date   LDLCALC 69 02/08/2020   Lab Results  Component Value Date   TRIG 203 (H) 02/08/2020   Lab Results  Component Value Date   CHOLHDL 5.1 (H) 02/08/2020   Lab Results  Component Value Date   HGBA1C 7.4 (H) 02/08/2020

## 2020-03-27 NOTE — Progress Notes (Deleted)
Cardiology Office Note   Date:  03/27/2020   ID:  Felicia Figueroa, DOB [birth year], MRN [id number]  PCP:  Loman Brooklyn, FNP  Cardiologist:   Minus Breeding, MD Referring:  Loman Brooklyn, FNP  No chief complaint on file.     History of Present Illness: Felicia Figueroa is a 59 y.o. female who is referred by Loman Brooklyn, FNP for evaluation of CAD.  Cardiac cath in 2012 done at [place] demonstrated single-vessel coronary artery disease with occlusion of the second obtuse marginal branch.  She had severe hypokinesis of the anterior wall with an ejection fraction of 35%.  Other catheterizations I was able to review include cath in 2010 showed subtotal stenosis of that marginal.  In 2009 catheterization mentions a stent in the circumflex that had 50% in-stent restenosis.  There is a perfusion study from 2015 but I was unable to obtain these results.   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  When I first saw her she was having chest pain and she was to have a [employer].  However, we were unable to contact her to arrange this.   ***  *** She has been doing relatively okay although she has pains under her left breast and through to her back.  This is not like the arm pain she had with her previous stent which she thinks was around 2009.  She notices some dyspnea or discomfort if she carries in a lot of groceries with the laundry.  More of this is in the lumbar back.  She is not describing substernal chest pain or neck discomfort.  She has some dyspnea with exertion which is somewhat chronic but she is not having any PND or orthopnea.  Is not having any palpitations, presyncope or syncope.  Has had no weight gain or edema..  Past Medical History:  Diagnosis Date   Anxiety    CAD (coronary artery disease)    COPD (chronic obstructive pulmonary disease) ([place])    Depression    Diabetes ([place])     History of MI (myocardial infarction)    Hypertension    PVD (peripheral vascular disease) ([place])     Past Surgical History:  Procedure Laterality Date   AMPUTATION TOE     CARDIAC SURGERY     Stent    KIDNEY SURGERY     stents placed     Current Outpatient Medications  Medication Sig Dispense Refill   Accu-Chek FastClix Lancets MISC Use to check blood sugar 3 daily     albuterol (VENTOLIN HFA) 108 (90 Base) MCG/ACT inhaler Inhale 2 puffs into the lungs every 6 (six) hours as needed for wheezing or shortness of breath. 18 g 2   aspirin EC 81 MG tablet Take 1 tablet (81 mg total) by mouth daily. 90 tablet 3   atorvastatin (LIPITOR) 80 MG tablet Take 1 tablet (80 mg total) by mouth daily. 90 tablet 3   BD VEO INSULIN SYRINGE U/F 31G X 15/64" 0.3 ML MISC See admin instructions. with insulin     Blood Glucose Monitoring Suppl (ONETOUCH VERIO REFLECT) w/Device KIT Use test blood sugar daily in the morning 1 kit 1   doxepin (SINEQUAN) 10 MG capsule Take 1-2 capsules (10-20 mg total) by mouth at bedtime. 30 capsule 2   Empagliflozin-linaGLIPtin (GLYXAMBI) 10-5 MG TABS Take 1 tablet by mouth daily. 30 tablet 2   gabapentin (NEURONTIN)  600 MG tablet Take 1 tablet (600 mg total) by mouth 2 (two) times daily. (Patient taking differently: Take 600 mg by mouth 2 (two) times daily as needed (fibromyalgia). ) 60 tablet 3   glucose blood test strip Use as instructed to test blood sugars daily.  One touch verio test strips 100 each 12   ibuprofen (ADVIL) 800 MG tablet Take 1 tablet (800 mg total) by mouth every 6 (six) hours as needed. (Patient taking differently: Take 800 mg by mouth See admin instructions. Take one tablet (800 mg) by mouth every morning, may also take one tablet (800 mg) at night as needed for pain) 90 tablet 1   Insulin Syringe-Needle U-100 31G X 15/64" 0.3 ML MISC USE AS DIRECTED WITH INSULIN     Lancets (ONETOUCH ULTRASOFT) lancets 1 each by Other route 2 (two)  times daily. Use as instructed 100 each 2   lisinopril-hydrochlorothiazide (ZESTORETIC) 10-12.5 MG tablet Take 1 tablet by mouth daily. 90 tablet 3   mupirocin ointment (BACTROBAN) 2 % Apply 1 application topically every other day. Apply to right big toe     umeclidinium-vilanterol (ANORO ELLIPTA) 62.5-25 MCG/INH AEPB Inhale 1 puff into the lungs daily. Sample given  LOT SD2W, EXP 5/22     No current facility-administered medications for this visit.    Allergies:   Cephalexin, Hydrocodone-acetaminophen, and Sulfa antibiotics    ROS:  Please see the history of present illness.   Otherwise, review of systems are positive for none.   All other systems are reviewed and negative.    PHYSICAL EXAM: VS:  There were no vitals taken for this visit. , BMI There is no height or weight on file to calculate BMI. GENERAL:  Well appearing NECK:  No jugular venous distention, waveform within normal limits, carotid upstroke brisk and symmetric, no bruits, no thyromegaly LUNGS:  Clear to auscultation bilaterally CHEST:  Unremarkable HEART:  PMI not displaced or sustained,S1 and S2 within normal limits, no S3, no S4, no clicks, no rubs, *** murmurs ABD:  Flat, positive bowel sounds normal in frequency in pitch, no bruits, no rebound, no guarding, no midline pulsatile mass, no hepatomegaly, no splenomegaly EXT:  2 plus pulses throughout, no edema, no cyanosis no clubbing, status post right great toe amputation.  ***    ***GENERAL:  Well appearing HEENT:  Pupils equal round and reactive, fundi not visualized, oral mucosa unremarkable NECK:  No jugular venous distention, waveform within normal limits, carotid upstroke brisk and symmetric, no bruits, no thyromegaly LYMPHATICS:  No cervical, inguinal adenopathy LUNGS:  Clear to auscultation bilaterally BACK:  No CVA tenderness CHEST:  Unremarkable HEART:  PMI not displaced or sustained,S1 and S2 within normal limits, no S3, no S4, no clicks, no rubs, no  murmurs ABD:  Flat, positive bowel sounds normal in frequency in pitch, no bruits, no rebound, no guarding, no midline pulsatile mass, no hepatomegaly, no splenomegaly EXT:  2 plus pulses throughout, no edema, no cyanosis no clubbing, status post right great toe amputation SKIN:  No rashes no nodules NEURO:  Cranial nerves II through XII grossly intact, motor grossly intact throughout PSYCH:  Cognitively intact, oriented to person place and time   EKG:  EKG is *** ordered today. The ekg ordered today demonstrates sinus rhythm, rate ***, axis within normal limits, intervals within normal limits, nonspecific lateral T wave changes.   Recent Labs: 10/21/2019: TSH 2.030 02/08/2020: ALT 13 02/19/2020: BUN 20; Creatinine, Ser 2.20; Hemoglobin 13.7; Platelets 222; Potassium 4.2;  Sodium 134    Lipid Panel    Component Value Date/Time   CHOL 128 02/08/2020 1154   TRIG 203 (H) 02/08/2020 1154   HDL 25 (L) 02/08/2020 1154   CHOLHDL 5.1 (H) 02/08/2020 1154   LDLCALC 69 02/08/2020 1154      Wt Readings from Last 3 Encounters:  03/23/20 135 lb (61.2 kg)  02/08/20 138 lb 9.6 oz (62.9 kg)  11/16/19 143 lb (64.9 kg)      Other studies Reviewed: Additional studies/ records that were reviewed today include: *** Review of the above records demonstrates:  Please see elsewhere in the note.     ASSESSMENT AND PLAN:  CAD:   ***  She does have some discomfort that is atypical.  She has a long history as above.  She needs to be screened with a stress test.  However, she would not be able walk on a treadmill so she will have a [employer].  She needs aggressive risk reduction regardless of these results.  Of note she should be on a baby aspirin.  HTN:   ***  Her blood pressure is mildly elevated but she just had lisinopril HCT added and she has not started it yet.  I agree with this.  DM: A1c was *** 7.2.  Therapy per Loman Brooklyn, FNP  DYSLIPIDEMIA:   LDL is *** 105.  I am going to  increase her Lipitor to 80 mg daily.  COVID EDUCATION:   *** We talked about the vaccine.   Current medicines are reviewed at length with the patient today.  The patient does not have concerns regarding medicines.  The following changes have been made:  no change  Labs/ tests ordered today include:   No orders of the defined types were placed in this encounter.    Disposition:   FU with me in 4 months.    Signed, Minus Breeding, MD  03/27/2020 6:27 PM    [place]

## 2020-03-28 ENCOUNTER — Ambulatory Visit: Payer: Medicare Other | Admitting: Cardiology

## 2020-05-07 ENCOUNTER — Other Ambulatory Visit: Payer: Self-pay | Admitting: Family Medicine

## 2020-05-08 ENCOUNTER — Other Ambulatory Visit: Payer: Self-pay

## 2020-05-08 DIAGNOSIS — E1142 Type 2 diabetes mellitus with diabetic polyneuropathy: Secondary | ICD-10-CM

## 2020-05-08 MED ORDER — GABAPENTIN 600 MG PO TABS: 600.0000 mg | ORAL_TABLET | Freq: Two times a day (BID) | ORAL | 2 refills | Status: DC

## 2020-05-24 ENCOUNTER — Ambulatory Visit (INDEPENDENT_AMBULATORY_CARE_PROVIDER_SITE_OTHER): Payer: Medicare Other | Admitting: Family Medicine

## 2020-05-24 ENCOUNTER — Encounter: Payer: Self-pay | Admitting: Family Medicine

## 2020-05-24 ENCOUNTER — Other Ambulatory Visit: Payer: Self-pay

## 2020-05-24 VITALS — BP 133/74 | HR 86 | Temp 98.3°F | Ht 65.0 in | Wt 134.8 lb

## 2020-05-24 DIAGNOSIS — F331 Major depressive disorder, recurrent, moderate: Secondary | ICD-10-CM | POA: Diagnosis not present

## 2020-05-24 DIAGNOSIS — Z794 Long term (current) use of insulin: Secondary | ICD-10-CM

## 2020-05-24 DIAGNOSIS — E118 Type 2 diabetes mellitus with unspecified complications: Secondary | ICD-10-CM

## 2020-05-24 DIAGNOSIS — J410 Simple chronic bronchitis: Secondary | ICD-10-CM | POA: Diagnosis not present

## 2020-05-24 DIAGNOSIS — F411 Generalized anxiety disorder: Secondary | ICD-10-CM

## 2020-05-24 DIAGNOSIS — G479 Sleep disorder, unspecified: Secondary | ICD-10-CM | POA: Diagnosis not present

## 2020-05-24 LAB — BAYER DCA HB A1C WAIVED: HB A1C (BAYER DCA - WAIVED): 9.5 % — ABNORMAL HIGH (ref <7.0)

## 2020-05-24 MED ORDER — ESCITALOPRAM OXALATE 10 MG PO TABS: 10.0000 mg | ORAL_TABLET | Freq: Every day | ORAL | 2 refills | Status: DC

## 2020-05-24 MED ORDER — GLYXAMBI 25-5 MG PO TABS: 1.0000 | ORAL_TABLET | Freq: Every day | ORAL | 2 refills | Status: DC

## 2020-05-24 NOTE — Progress Notes (Signed)
Assessment & Plan:  1. Generalized anxiety disorder - Uncontrolled. Starting patient on Lexapro today. She will decrease doxepin from 20 mg to 10 mg x1 week, then stop it. Patient has previously failed therapy with Zoloft, Prozac, Buspar, and Paxil; now Doxepin.  - escitalopram (LEXAPRO) 10 MG tablet; Take 1 tablet (10 mg total) by mouth daily.  Dispense: 30 tablet; Refill: 2  2. Moderate episode of recurrent major depressive disorder (HCC) - Uncontrolled. Starting patient on Lexapro today. She will decrease doxepin from 20 mg to 10 mg x1 week, then stop it. Patient has previously failed therapy with Zoloft, Prozac, Buspar, and Paxil; now Doxepin.  - escitalopram (LEXAPRO) 10 MG tablet; Take 1 tablet (10 mg total) by mouth daily.  Dispense: 30 tablet; Refill: 2  3. Difficulty sleeping - Focusing on anxiety and depression for now and will come back to sleep.   4. Simple chronic bronchitis ([place]) - Well controlled on current regimen.   5. Type 2 diabetes mellitus with complication, with long-term current use of insulin (HCC) Lab Results  Component Value Date   HGBA1C 9.5 (H) 05/24/2020   HGBA1C 7.4 (H) 02/08/2020   HGBA1C 7.2 (H) 10/21/2019  - Diabetes is not at goal of A1c < 7. - Medications: Glyxambi increased - Home glucose monitoring: continue monitoring  - Patient is currently taking a statin. Patient is taking an ACE-inhibitor/ARB.  - Last foot exam: 02/08/2020 - Last diabetic eye exam: unknown - Urine Microalbumin/Creat Ratio: 11/10/2019 - Instruction/counseling given: reminded to bring blood glucose meter & log to each visit - Bayer DCA Hb A1c Waived - Empagliflozin-linaGLIPtin (GLYXAMBI) 25-5 MG TABS; Take 1 tablet by mouth daily.  Dispense: 30 tablet; Refill: 2 - CMP14+EGFR   Return in about 4 weeks (around 06/21/2020) for with Almyra Free for DM; 6 weeks with me for anxiety/depression (telephone); 3 months with me DM.  Hendricks Limes, MSN, APRN, FNP-C [facility]  Subjective:    Patient ID: VERDIA Figueroa, female    DOB: May 14, 1961, 59 y.o.   MRN: [id number]  Patient Care Team: Loman Brooklyn, FNP as PCP - General (Family Medicine) Minus Breeding, MD as PCP - Cardiology (Cardiology) Lavera Guise, [facility] (Pharmacist)   Chief Complaint:  Chief Complaint  Patient presents with  . Diabetes  . Insomnia  . Depression    HPI: Felicia Figueroa is a 59 y.o. female presenting on 05/24/2020 for Diabetes, Insomnia, and Depression  COPD: Patient is using Anoro every day. She only requires Albuterol when she is outside in the heat or carrying groceries in.  Patient was started on Doxepin to help with difficulty sleeping, anxiety, and depression. She does not feel it is doing anything for her. She did increase from 10 mg to 20 mg and still couldn't tell a difference.   Depression screen [facility] 2/9 05/24/2020 03/23/2020 02/08/2020  Decreased Interest _0 Down, Depressed, Hopeless _1 PHQ - 2 Score _2 Altered sleeping _3 Tired, decreased energy _4 Change in appetite _5 Feeling bad or failure about yourself  0 2 2  Trouble concentrating _6 Moving slowly or fidgety/restless _7 Suicidal thoughts 0 0 0  PHQ-9 Score _8 GAD 7 : Generalized Anxiety Score 05/24/2020 03/23/2020 02/08/2020 10/21/2019  Nervous, Anxious, on Edge _9 Control/stop worrying _10 Worry  too much - different things _0 Trouble relaxing _1 Restless _2 Easily annoyed or irritable _3 Afraid - awful might happen _4 Total GAD 7 Score _5 Anxiety Difficulty Somewhat difficult - - Very difficult    Diabetes: Patient presents for follow up of diabetes.  Known diabetic complications: cardiovascular disease. Medication compliance: yes. Current diet: in general, a "healthy" diet  . Home blood sugar records: patient reports blood glucose <120. Is she  on ACE inhibitor or angiotensin II receptor  blocker? Yes. Is she on a statin? Yes.   Lab Results  Component Value Date   HGBA1C 9.5 (H) 05/24/2020   HGBA1C 7.4 (H) 02/08/2020   HGBA1C 7.2 (H) 10/21/2019   Lab Results  Component Value Date   LDLCALC 69 02/08/2020   CREATININE 2.20 (H) 02/19/2020     New complaints: None  Social history:  Relevant past medical, surgical, family and social history reviewed and updated as indicated. Interim medical history since our last visit reviewed.  Allergies and medications reviewed and updated.  DATA REVIEWED: CHART IN EPIC  ROS: Negative unless specifically indicated above in HPI.    Current Outpatient Medications:  .  Accu-Chek FastClix Lancets MISC, Use to check blood sugar 3 daily, Disp: , Rfl:  .  albuterol (VENTOLIN HFA) 108 (90 Base) MCG/ACT inhaler, Inhale 2 puffs into the lungs every 6 (six) hours as needed for wheezing or shortness of breath., Disp: 18 g, Rfl: 2 .  aspirin EC 81 MG tablet, Take 1 tablet (81 mg total) by mouth daily., Disp: 90 tablet, Rfl: 3 .  atorvastatin (LIPITOR) 80 MG tablet, Take 1 tablet (80 mg total) by mouth daily., Disp: 90 tablet, Rfl: 3 .  BD VEO INSULIN SYRINGE U/F 31G X 15/64" 0.3 ML MISC, See admin instructions. with insulin, Disp: , Rfl:  .  Blood Glucose Monitoring Suppl (ONETOUCH VERIO REFLECT) w/Device KIT, Use test blood sugar daily in the morning, Disp: 1 kit, Rfl: 1 .  doxepin (SINEQUAN) 10 MG capsule, Take 1-2 capsules (10-20 mg total) by mouth at bedtime., Disp: 30 capsule, Rfl: 2 .  Empagliflozin-linaGLIPtin (GLYXAMBI) 10-5 MG TABS, Take 1 tablet by mouth daily., Disp: 30 tablet, Rfl: 2 .  gabapentin (NEURONTIN) 600 MG tablet, Take 1 tablet (600 mg total) by mouth 2 (two) times daily., Disp: 60 tablet, Rfl: 2 .  glucose blood test strip, Use as instructed to test blood sugars daily.  One touch verio test strips, Disp: 100 each, Rfl: 12 .  ibuprofen (ADVIL) 800 MG tablet, TAKE (1) TABLET EVERY SIX HOURS AS NEEDED., Disp: 90 tablet,  Rfl: 0 .  Insulin Syringe-Needle U-100 31G X 15/64" 0.3 ML MISC, USE AS DIRECTED WITH INSULIN, Disp: , Rfl:  .  Lancets (ONETOUCH ULTRASOFT) lancets, 1 each by Other route 2 (two) times daily. Use as instructed, Disp: 100 each, Rfl: 2 .  lisinopril-hydrochlorothiazide (ZESTORETIC) 10-12.5 MG tablet, Take 1 tablet by mouth daily., Disp: 90 tablet, Rfl: 3 .  mupirocin ointment (BACTROBAN) 2 %, Apply 1 application topically every other day. Apply to right big toe, Disp: , Rfl:  .  umeclidinium-vilanterol (ANORO ELLIPTA) 62.5-25 MCG/INH AEPB, Inhale 1 puff into the lungs daily. Sample given  LOT SD2W, EXP 5/22, Disp: , Rfl:  .  traZODone (DESYREL) 50 MG tablet, Take 50-100 mg by mouth at bedtime as needed. (Patient not taking: Reported  on 05/24/2020), Disp: , Rfl:    Allergies  Allergen Reactions  . Cephalexin Nausea And Vomiting  . Hydrocodone-Acetaminophen Itching, Nausea And Vomiting and Rash  . Sulfa Antibiotics Rash   Past Medical History:  Diagnosis Date  . Anxiety   . CAD (coronary artery disease)   . COPD (chronic obstructive pulmonary disease) ([place])   . Depression   . Diabetes ([place])   . History of MI (myocardial infarction)   . Hypertension   . PVD (peripheral vascular disease) ([place])     Past Surgical History:  Procedure Laterality Date  . AMPUTATION TOE    . CARDIAC SURGERY     Stent   . KIDNEY SURGERY     stents placed    Social History   Socioeconomic History  . Marital status: Divorced    Spouse name: Not on file  . Number of children: 2  . Years of education: 10  . Highest education level: 10th grade  Occupational History  . Occupation: Disabled  Tobacco Use  . Smoking status: Current Every Day Smoker    Packs/day: 1.00    Years: 35.00    Pack years: 35.00    Types: Cigarettes  . Smokeless tobacco: Never Used  Vaping Use  . Vaping Use: Never used  Substance and Sexual Activity  . Alcohol use: Not Currently  . Drug use: Never  . Sexual activity: Not  Currently  Other Topics Concern  . Not on file  Social History Narrative   Lives alone.   Two daughters.    Social Determinants of Health   Financial Resource Strain: Low Risk   . Difficulty of Paying Living Expenses: Not hard at all  Food Insecurity: No Food Insecurity  . Worried About Charity fundraiser in the Last Year: Never true  . Ran Out of Food in the Last Year: Never true  Transportation Needs: No Transportation Needs  . Lack of Transportation (Medical): No  . Lack of Transportation (Non-Medical): No  Physical Activity: Inactive  . Days of Exercise per Week: 0 days  . Minutes of Exercise per Session: 0 min  Stress: Stress Concern Present  . Feeling of Stress : To some extent  Social Connections: Socially Isolated  . Frequency of Communication with Friends and Family: More than three times a week  . Frequency of Social Gatherings with Friends and Family: More than three times a week  . Attends Religious Services: Never  . Active Member of Clubs or Organizations: No  . Attends Archivist Meetings: Never  . Marital Status: Divorced  Human resources officer Violence: Not At Risk  . Fear of Current or Ex-Partner: No  . Emotionally Abused: No  . Physically Abused: No  . Sexually Abused: No        Objective:    BP 133/74   Pulse 86   Temp 98.3 F (36.8 C) (Temporal)   Ht _0  (1.651 m)   Wt 134 lb 12.8 oz (61.1 kg)   BMI 22.43 kg/m   Wt Readings from Last 3 Encounters:  05/24/20 134 lb 12.8 oz (61.1 kg)  03/23/20 135 lb (61.2 kg)  02/08/20 138 lb 9.6 oz (62.9 kg)    Physical Exam Vitals reviewed.  Constitutional:      General: She is not in acute distress.    Appearance: Normal appearance. She is normal weight. She is not ill-appearing, toxic-appearing or diaphoretic.  HENT:     Head: Normocephalic and atraumatic.  Eyes:  General: No scleral icterus.       Right eye: No discharge.        Left eye: No discharge.     Conjunctiva/sclera:  Conjunctivae normal.  Cardiovascular:     Rate and Rhythm: Normal rate and regular rhythm.     Heart sounds: Normal heart sounds. No murmur heard.  No friction rub. No gallop.   Pulmonary:     Effort: Pulmonary effort is normal. No respiratory distress.     Breath sounds: Normal breath sounds. No stridor. No wheezing, rhonchi or rales.  Musculoskeletal:        General: Normal range of motion.     Cervical back: Normal range of motion.  Skin:    General: Skin is warm and dry.     Capillary Refill: Capillary refill takes less than 2 seconds.  Neurological:     General: No focal deficit present.     Mental Status: She is alert and oriented to person, place, and time. Mental status is at baseline.  Psychiatric:        Mood and Affect: Mood normal.        Behavior: Behavior normal.        Thought Content: Thought content normal.        Judgment: Judgment normal.     Lab Results  Component Value Date   TSH 2.030 10/21/2019   Lab Results  Component Value Date   WBC 11.7 (H) 02/19/2020   HGB 13.7 02/19/2020   HCT 41.3 02/19/2020   MCV 95.8 02/19/2020   PLT 222 02/19/2020   Lab Results  Component Value Date   NA 134 (L) 02/19/2020   K 4.2 02/19/2020   CO2 21 (L) 02/19/2020   GLUCOSE 195 (H) 02/19/2020   BUN 20 02/19/2020   CREATININE 2.20 (H) 02/19/2020   BILITOT <0.2 02/08/2020   ALKPHOS 105 02/08/2020   AST 15 02/08/2020   ALT 13 02/08/2020   PROT 6.0 02/08/2020   ALBUMIN 3.7 (L) 02/08/2020   CALCIUM 8.6 (L) 02/19/2020   ANIONGAP 9 02/19/2020   Lab Results  Component Value Date   CHOL 128 02/08/2020   Lab Results  Component Value Date   HDL 25 (L) 02/08/2020   Lab Results  Component Value Date   LDLCALC 69 02/08/2020   Lab Results  Component Value Date   TRIG 203 (H) 02/08/2020   Lab Results  Component Value Date   CHOLHDL 5.1 (H) 02/08/2020   Lab Results  Component Value Date   HGBA1C 7.4 (H) 02/08/2020

## 2020-05-24 NOTE — Patient Instructions (Signed)
Decrease doxepin to 10 mg x1 week, then stop it.   Start Lexapro now.

## 2020-05-25 LAB — CMP14+EGFR
ALT: 8 IU/L (ref 0–32)
AST: 11 IU/L (ref 0–40)
Albumin/Globulin Ratio: 1.5 (ref 1.2–2.2)
Albumin: 3.8 g/dL (ref 3.8–4.9)
Alkaline Phosphatase: 114 IU/L (ref 48–121)
BUN/Creatinine Ratio: 8 — ABNORMAL LOW (ref 9–23)
BUN: 8 mg/dL (ref 6–24)
Bilirubin Total: <0.2 mg/dL (ref 0.0–1.2)
CO2: 20 mmol/L (ref 20–29)
Calcium: 8.8 mg/dL (ref 8.7–10.2)
Chloride: 108 mmol/L — ABNORMAL HIGH (ref 96–106)
Creatinine, Ser: 1 mg/dL (ref 0.57–1.00)
GFR calc Af Amer: 71 mL/min/{1.73_m2} (ref >59)
GFR calc non Af Amer: 62 mL/min/{1.73_m2} (ref >59)
Globulin, Total: 2.5 g/dL (ref 1.5–4.5)
Glucose: 169 mg/dL — ABNORMAL HIGH (ref 65–99)
Potassium: 4 mmol/L (ref 3.5–5.2)
Sodium: 141 mmol/L (ref 134–144)
Total Protein: 6.3 g/dL (ref 6.0–8.5)

## 2020-06-21 ENCOUNTER — Encounter: Payer: Self-pay | Admitting: Pharmacist

## 2020-06-21 ENCOUNTER — Ambulatory Visit (INDEPENDENT_AMBULATORY_CARE_PROVIDER_SITE_OTHER): Payer: Medicare Other | Admitting: Pharmacist

## 2020-06-21 ENCOUNTER — Other Ambulatory Visit: Payer: Self-pay

## 2020-06-21 ENCOUNTER — Other Ambulatory Visit: Payer: Self-pay | Admitting: Family Medicine

## 2020-06-21 VITALS — BP 141/83

## 2020-06-21 DIAGNOSIS — G479 Sleep disorder, unspecified: Secondary | ICD-10-CM

## 2020-06-21 DIAGNOSIS — E119 Type 2 diabetes mellitus without complications: Secondary | ICD-10-CM

## 2020-06-21 DIAGNOSIS — F411 Generalized anxiety disorder: Secondary | ICD-10-CM

## 2020-06-21 DIAGNOSIS — F331 Major depressive disorder, recurrent, moderate: Secondary | ICD-10-CM

## 2020-06-21 MED ORDER — ONETOUCH VERIO VI STRP: ORAL_STRIP | 12 refills | Status: AC

## 2020-06-21 MED ORDER — METFORMIN HCL ER 500 MG PO TB24: 500.0000 mg | ORAL_TABLET | Freq: Every day | ORAL | 1 refills | Status: DC

## 2020-06-21 MED ORDER — HYDROXYZINE HCL 25 MG PO TABS: 25.0000 mg | ORAL_TABLET | Freq: Three times a day (TID) | ORAL | 0 refills | Status: DC | PRN

## 2020-06-21 MED ORDER — ONETOUCH DELICA LANCETS 30G MISC: 3 refills | Status: AC

## 2020-06-21 NOTE — Progress Notes (Signed)
    06/21/2020 Name: Felicia Figueroa MRN: [id number] DOB: 1961/03/20   S:   Presents for diabetes evaluation, education, and management Patient was referred and last seen by Primary Care Provider on 05/24/20.   Insurance coverage/medication affordability: [employer]  Patient reports adherence with medications. . Current diabetes medications include: glyxambi . Current hypertension medications include: lisinopril/hctz Goal 130/80 . Current hyperlipidemia medications include: atorvastatin    Patient denies hypoglycemic events.   Patient reported dietary habits: Eats 2-3 meals/day -Does report eating country cooking at times, but is mindful of carbohydrates  The patient is asked to make an attempt to improve diet and exercise patterns to aid in medical management of this problem.  Discussed meal planning options and Plate method for healthy eating . Avoid sugary drinks and desserts . Incorporate balanced protein, non starchy veggies, 1 serving of carbohydrate with each meal . Increase water intake . Increase physical activity as able  Patient-reported exercise habits: n/a  O:  Lab Results  Component Value Date   HGBA1C 9.5 (H) 05/24/2020    There were no vitals filed for this visit.  Lipid Panel     Component Value Date/Time   CHOL 128 02/08/2020 1154   TRIG 203 (H) 02/08/2020 1154   HDL 25 (L) 02/08/2020 1154   CHOLHDL 5.1 (H) 02/08/2020 1154   LDLCALC 69 02/08/2020 1154     Home fasting blood sugars: n/a  2 hour post-meal/random blood sugars: n/a.    A/P:  Diabetes T2DM currently uncontrolled. Patient is adherent with medication.  -Start metformin XR 500mg  daily  -Continue glyxambi 25/5mg   -Start hydroxyzine PRN for anxiety--patient is doing well on lexapro  -Called in test strips and lancets to , encouraged patient to check once daily (fasting)  -Extensively discussed pathophysiology of diabetes, recommended lifestyle interventions,  dietary effects on blood sugar control  -Counseled on s/sx of and management of hypoglycemia  -Next A1C anticipated 09/2020   Written patient instructions provided.  Total time in face to face counseling 30 minutes.   Follow up PCP Clinic Visit ON 09/2020.    10/2020, PharmD, BCPS Clinical Pharmacist, [facility]  II Phone [phone]

## 2020-06-29 ENCOUNTER — Other Ambulatory Visit: Payer: Self-pay | Admitting: Family Medicine

## 2020-06-29 DIAGNOSIS — F411 Generalized anxiety disorder: Secondary | ICD-10-CM

## 2020-06-29 DIAGNOSIS — G479 Sleep disorder, unspecified: Secondary | ICD-10-CM

## 2020-06-29 DIAGNOSIS — F331 Major depressive disorder, recurrent, moderate: Secondary | ICD-10-CM

## 2020-07-05 ENCOUNTER — Ambulatory Visit (INDEPENDENT_AMBULATORY_CARE_PROVIDER_SITE_OTHER): Payer: Medicare Other | Admitting: Family Medicine

## 2020-07-05 DIAGNOSIS — F411 Generalized anxiety disorder: Secondary | ICD-10-CM

## 2020-07-05 DIAGNOSIS — F331 Major depressive disorder, recurrent, moderate: Secondary | ICD-10-CM

## 2020-07-05 NOTE — Progress Notes (Signed)
Virtual Visit via Telephone Note  I connected with Felicia Figueroa on 07/05/20 at 9:57 AM by telephone and verified that I am speaking with the correct person using two identifiers. Felicia Figueroa is currently located at home and nobody is currently with her during this visit. The provider, Loman Brooklyn, FNP is located in their office at time of visit.  I discussed the limitations, risks, security and privacy concerns of performing an evaluation and management service by telephone and the availability of in person appointments. I also discussed with the patient that there may be a patient responsible charge related to this service. The patient expressed understanding and agreed to proceed.  Subjective: PCP: Loman Brooklyn, FNP  Chief Complaint  Patient presents with  . Anxiety  . Depression   Patient is following up on anxiety and depression. At her last visit she was started on Lexapro 10 mg once daily. She reports she is doing good with the new medication. She had previously failed therapy with Zoloft, Prozac, Buspar, Paxil, and Doxepin.   Depression screen [facility] 2/9 07/05/2020 05/24/2020 03/23/2020  Decreased Interest 0 1 1  Down, Depressed, Hopeless 0 1 1  PHQ - 2 Score 0 2 2  Altered sleeping 0 1 3  Tired, decreased energy 0 1 3  Change in appetite 0 1 3  Feeling bad or failure about yourself  0 0 2  Trouble concentrating 0 1 1  Moving slowly or fidgety/restless 0 1 1  Suicidal thoughts 0 0 0  PHQ-9 Score 0 7 15  Difficult doing work/chores Not difficult at all - -   GAD 7 : Generalized Anxiety Score 07/05/2020 05/24/2020 03/23/2020 02/08/2020  Nervous, Anxious, on Edge 0 3 3 3   Control/stop worrying 0 3 3 3   Worry too much - different things 0 3 3 3   Trouble relaxing 0 3 3 3   Restless 0 3 1 3   Easily annoyed or irritable 0 2 3 3   Afraid - awful might happen 0 2 2 2   Total GAD 7 Score 0 19 18 20   Anxiety Difficulty Not difficult at all Somewhat difficult - -      ROS: Per HPI  Current Outpatient Medications:  .  albuterol (VENTOLIN HFA) 108 (90 Base) MCG/ACT inhaler, Inhale 2 puffs into the lungs every 6 (six) hours as needed for wheezing or shortness of breath., Disp: 18 g, Rfl: 2 .  aspirin EC 81 MG tablet, Take 1 tablet (81 mg total) by mouth daily., Disp: 90 tablet, Rfl: 3 .  atorvastatin (LIPITOR) 80 MG tablet, Take 1 tablet (80 mg total) by mouth daily., Disp: 90 tablet, Rfl: 3 .  Blood Glucose Monitoring Suppl (ONETOUCH VERIO REFLECT) w/Device KIT, Use test blood sugar daily in the morning, Disp: 1 kit, Rfl: 1 .  Empagliflozin-linaGLIPtin (GLYXAMBI) 25-5 MG TABS, Take 1 tablet by mouth daily., Disp: 30 tablet, Rfl: 2 .  escitalopram (LEXAPRO) 10 MG tablet, Take 1 tablet (10 mg total) by mouth daily., Disp: 30 tablet, Rfl: 2 .  gabapentin (NEURONTIN) 600 MG tablet, Take 1 tablet (600 mg total) by mouth 2 (two) times daily., Disp: 60 tablet, Rfl: 2 .  glucose blood (ONETOUCH VERIO) test strip, Use to test blood sugar daily as directed. DX: E11.9, Disp: 100 each, Rfl: 12 .  hydrOXYzine (ATARAX/VISTARIL) 25 MG tablet, Take 1 tablet (25 mg total) by mouth 3 (three) times daily as needed for anxiety., Disp: 90 tablet, Rfl: 0 .  ibuprofen (ADVIL)  800 MG tablet, TAKE (1) TABLET EVERY SIX HOURS AS NEEDED., Disp: 90 tablet, Rfl: 0 .  lisinopril-hydrochlorothiazide (ZESTORETIC) 10-12.5 MG tablet, Take 1 tablet by mouth daily., Disp: 90 tablet, Rfl: 3 .  metFORMIN (GLUCOPHAGE-XR) 500 MG 24 hr tablet, Take 1 tablet (500 mg total) by mouth daily with breakfast., Disp: 90 tablet, Rfl: 1 .  mupirocin ointment (BACTROBAN) 2 %, Apply 1 application topically every other day. Apply to right big toe, Disp: , Rfl:  .  OneTouch Delica Lancets 11E MISC, Use to test blood sugar daily as directed. DX: E11.9, Disp: 100 each, Rfl: 3 .  umeclidinium-vilanterol (ANORO ELLIPTA) 62.5-25 MCG/INH AEPB, Inhale 1 puff into the lungs daily. Sample given  LOT SD2W, EXP 5/22, Disp: ,  Rfl:   Allergies  Allergen Reactions  . Cephalexin Nausea And Vomiting  . Hydrocodone-Acetaminophen Itching, Nausea And Vomiting and Rash  . Sulfa Antibiotics Rash   Past Medical History:  Diagnosis Date  . Anxiety   . CAD (coronary artery disease)   . COPD (chronic obstructive pulmonary disease) ([place])   . Depression   . Diabetes ([place])   . History of MI (myocardial infarction)   . Hypertension   . PVD (peripheral vascular disease) ([place])     Observations/Objective: A&O  No respiratory distress or wheezing audible over the phone Mood, judgement, and thought processes all WNL  Assessment and Plan: 1. Generalized anxiety disorder - Well controlled on current regimen.   2. Moderate episode of recurrent major depressive disorder (HCC) - Well controlled on current regimen.    Follow Up Instructions: Return as scheduled.  I discussed the assessment and treatment plan with the patient. The patient was provided an opportunity to ask questions and all were answered. The patient agreed with the plan and demonstrated an understanding of the instructions.   The patient was advised to call back or seek an in-person evaluation if the symptoms worsen or if the condition fails to improve as anticipated.  The above assessment and management plan was discussed with the patient. The patient verbalized understanding of and has agreed to the management plan. Patient is aware to call the clinic if symptoms persist or worsen. Patient is aware when to return to the clinic for a follow-up visit. Patient educated on when it is appropriate to go to the emergency department.   Time call ended: 10:04 AM  I provided 9 minutes of non-face-to-face time during this encounter.  Hendricks Limes, MSN, APRN, FNP-C [facility] 07/05/20

## 2020-07-13 ENCOUNTER — Other Ambulatory Visit: Payer: Self-pay | Admitting: Family Medicine

## 2020-07-16 ENCOUNTER — Encounter: Payer: Self-pay | Admitting: Family Medicine

## 2020-08-10 ENCOUNTER — Ambulatory Visit: Payer: Medicare Other | Admitting: Family Medicine

## 2020-08-13 ENCOUNTER — Other Ambulatory Visit: Payer: Self-pay | Admitting: Family Medicine

## 2020-08-13 DIAGNOSIS — E1142 Type 2 diabetes mellitus with diabetic polyneuropathy: Secondary | ICD-10-CM

## 2020-08-16 ENCOUNTER — Ambulatory Visit (INDEPENDENT_AMBULATORY_CARE_PROVIDER_SITE_OTHER): Payer: Medicare Other | Admitting: Family Medicine

## 2020-08-16 ENCOUNTER — Other Ambulatory Visit: Payer: Self-pay

## 2020-08-16 ENCOUNTER — Encounter: Payer: Self-pay | Admitting: Family Medicine

## 2020-08-16 VITALS — BP 124/80 | HR 86 | Temp 97.0°F | Ht 65.0 in | Wt 131.8 lb

## 2020-08-16 DIAGNOSIS — E785 Hyperlipidemia, unspecified: Secondary | ICD-10-CM

## 2020-08-16 DIAGNOSIS — E1159 Type 2 diabetes mellitus with other circulatory complications: Secondary | ICD-10-CM | POA: Diagnosis not present

## 2020-08-16 DIAGNOSIS — E1169 Type 2 diabetes mellitus with other specified complication: Secondary | ICD-10-CM

## 2020-08-16 DIAGNOSIS — E118 Type 2 diabetes mellitus with unspecified complications: Secondary | ICD-10-CM | POA: Diagnosis not present

## 2020-08-16 DIAGNOSIS — F331 Major depressive disorder, recurrent, moderate: Secondary | ICD-10-CM

## 2020-08-16 DIAGNOSIS — E1142 Type 2 diabetes mellitus with diabetic polyneuropathy: Secondary | ICD-10-CM

## 2020-08-16 DIAGNOSIS — G8929 Other chronic pain: Secondary | ICD-10-CM

## 2020-08-16 DIAGNOSIS — M545 Low back pain, unspecified: Secondary | ICD-10-CM

## 2020-08-16 DIAGNOSIS — F411 Generalized anxiety disorder: Secondary | ICD-10-CM

## 2020-08-16 DIAGNOSIS — I152 Hypertension secondary to endocrine disorders: Secondary | ICD-10-CM

## 2020-08-16 DIAGNOSIS — K219 Gastro-esophageal reflux disease without esophagitis: Secondary | ICD-10-CM

## 2020-08-16 DIAGNOSIS — Z794 Long term (current) use of insulin: Secondary | ICD-10-CM

## 2020-08-16 LAB — BAYER DCA HB A1C WAIVED: HB A1C (BAYER DCA - WAIVED): 7 % — ABNORMAL HIGH (ref <7.0)

## 2020-08-16 MED ORDER — GLYXAMBI 25-5 MG PO TABS: 1.0000 | ORAL_TABLET | Freq: Every day | ORAL | 1 refills | Status: DC

## 2020-08-16 MED ORDER — IBUPROFEN 800 MG PO TABS: 800.0000 mg | ORAL_TABLET | Freq: Three times a day (TID) | ORAL | 0 refills | Status: DC | PRN

## 2020-08-16 MED ORDER — ESCITALOPRAM OXALATE 10 MG PO TABS: 10.0000 mg | ORAL_TABLET | Freq: Every day | ORAL | 1 refills | Status: DC

## 2020-08-16 MED ORDER — GABAPENTIN 600 MG PO TABS: 600.0000 mg | ORAL_TABLET | Freq: Two times a day (BID) | ORAL | 3 refills | Status: DC

## 2020-08-16 MED ORDER — ESOMEPRAZOLE MAGNESIUM 20 MG PO PACK: 20.0000 mg | PACK | Freq: Every day | ORAL | 12 refills | Status: DC

## 2020-08-16 NOTE — Progress Notes (Signed)
Subjective:  Patient ID: Felicia Figueroa, female    DOB: [birth year]  Age: 59 y.o. MRN: [id number]  CC: Diabetes (3 month follow up) and Heartburn   HPI Felicia Figueroa presents forFollow-up of diabetes.   1. T2DM Compliant with meds - Stopped taking metfomin 2 weeks ago. She did take it for 1 month but stopped due to diarrhea. Taking Glyxambi 25/5 daily without issue. She was previously on insulin Checking CBGs? Yes  Fasting avg - usually around 120 Exercising regularly? - walks throughout the day Watching carbohydrate intake? - Yes Neuropathy ? - yes, unchanged. Pain well controlled with gabapentin Hypoglycemic events - 1-2 x a month, reports lightheadedness, weakness  - Recovers with : food Pertinent ROS:  Polyuria - No Polydipsia - No Vision problems - No, not UTD for eye exam Statin- yes  2. GERD Compliant with medications - rolaids OTC- no relief, was previously on nexium with good relief Current medications - OTC rolaids Sore throat - No Voice change - No Hemoptysis - No Dysphagia or dyspepsia - burning pain is esophagus Water brash - Yes Red Flags (weight loss, hematochezia, melena, weight loss, early satiety, fevers, odynophagia, or persistent vomiting) - No  3. Depression/Anxiety Reports well controlled on Lexapro and hydroxyzine. Denies SI.   4. HTN Complaint with meds - Yes Current Medications - Zestoretic 10/12.5 mg daily Checking BP at home ranging 120/80s Exercising Regularly - walks a lot during the day Watching Salt intake - Yes Pertinent ROS:  Headache - No Fatigue - No Visual Disturbances - No Chest pain - No Dyspnea - No Palpitations - No LE edema - No They report good compliance with medications. No medication side effects.  Family, social, and smoking history reviewed.    History Felicia Figueroa has a past medical history of Anxiety, CAD (coronary artery disease), COPD (chronic obstructive pulmonary disease) ([place]), Depression,  Diabetes ([place]), History of MI (myocardial infarction), Hypertension, and PVD (peripheral vascular disease) ([place]).   She has a past surgical history that includes Amputation toe; Cardiac surgery; and Kidney surgery.   Her family history includes Anxiety disorder in her sister; COPD in her father; Cirrhosis in her father; Congestive Heart Failure in her sister; Depression in her mother; Diabetes in her sister; Stroke in her mother; Tremor in her sister.She reports that she has been smoking cigarettes. She has a 35.00 pack-year smoking history. She has never used smokeless tobacco. She reports previous alcohol use. She reports that she does not use drugs.  Current Outpatient Medications on File Prior to Visit  Medication Sig Dispense Refill  . albuterol (VENTOLIN HFA) 108 (90 Base) MCG/ACT inhaler Inhale 2 puffs into the lungs every 6 (six) hours as needed for wheezing or shortness of breath. 18 g 2  . aspirin EC 81 MG tablet Take 1 tablet (81 mg total) by mouth daily. 90 tablet 3  . atorvastatin (LIPITOR) 80 MG tablet Take 1 tablet (80 mg total) by mouth daily. 90 tablet 3  . Blood Glucose Monitoring Suppl (ONETOUCH VERIO REFLECT) w/Device KIT Use test blood sugar daily in the morning 1 kit 1  . Empagliflozin-linaGLIPtin (GLYXAMBI) 25-5 MG TABS Take 1 tablet by mouth daily. 30 tablet 2  . escitalopram (LEXAPRO) 10 MG tablet Take 1 tablet (10 mg total) by mouth daily. 30 tablet 2  . gabapentin (NEURONTIN) 600 MG tablet TAKE  (1)  TABLET TWICE A DAY. 60 tablet 0  . glucose blood (ONETOUCH VERIO) test strip Use to test blood sugar daily  as directed. DX: E11.9 100 each 12  . hydrOXYzine (ATARAX/VISTARIL) 25 MG tablet Take 1 tablet 3 times daily as needed for anxiety. 90 tablet 0  . ibuprofen (ADVIL) 800 MG tablet TAKE (1) TABLET EVERY SIX HOURS AS NEEDED. 90 tablet 0  . lisinopril-hydrochlorothiazide (ZESTORETIC) 10-12.5 MG tablet Take 1 tablet by mouth daily. 90 tablet 3  . mupirocin ointment  (BACTROBAN) 2 % Apply 1 application topically every other day. Apply to right big toe    . OneTouch Delica Lancets 62G MISC Use to test blood sugar daily as directed. DX: E11.9 100 each 3  . umeclidinium-vilanterol (ANORO ELLIPTA) 62.5-25 MCG/INH AEPB Inhale 1 puff into the lungs daily. Sample given  LOT SD2W, EXP 5/22     No current facility-administered medications on file prior to visit.    ROS Review of Systems  Per HPI.  Objective:  BP 124/80   Pulse 86   Temp (!) 97 F (36.1 C) (Temporal)   Ht _0  (1.651 m)   Wt 131 lb 12.8 oz (59.8 kg)   SpO2 98%   BMI 21.93 kg/m   BP Readings from Last 3 Encounters:  08/16/20 124/80  06/21/20 (!) 141/83  05/24/20 133/74    Wt Readings from Last 3 Encounters:  08/16/20 131 lb 12.8 oz (59.8 kg)  05/24/20 134 lb 12.8 oz (61.1 kg)  03/23/20 135 lb (61.2 kg)    Physical Exam Vitals and nursing note reviewed.  Constitutional:      General: She is not in acute distress.    Appearance: Normal appearance. She is not ill-appearing or toxic-appearing.  HENT:     Head: Normocephalic and atraumatic.  Eyes:     Extraocular Movements: Extraocular movements intact.     Pupils: Pupils are equal, round, and reactive to light.  Neck:     Vascular: No carotid bruit.  Cardiovascular:     Rate and Rhythm: Normal rate and regular rhythm.     Heart sounds: Normal heart sounds. No murmur heard.   Pulmonary:     Effort: Pulmonary effort is normal. No respiratory distress.     Breath sounds: Normal breath sounds.  Abdominal:     General: Bowel sounds are normal. There is no distension.     Palpations: Abdomen is soft. There is no mass.     Tenderness: There is no abdominal tenderness. There is no guarding or rebound.     Hernia: No hernia is present.  Musculoskeletal:     Cervical back: Neck supple. No tenderness.     Right lower leg: No edema.     Left lower leg: No edema.  Lymphadenopathy:     Cervical: No cervical adenopathy.   Skin:    General: Skin is warm and dry.  Neurological:     General: No focal deficit present.     Mental Status: She is alert and oriented to person, place, and time.  Psychiatric:        Mood and Affect: Mood normal.        Behavior: Behavior normal.     Assessment & Plan:  Nyelle was seen today for diabetes and heartburn.  Diagnoses and all orders for this visit:  Diabetic polyneuropathy associated with type 2 diabetes mellitus (HCC) Unchanged from baseline. Refill provided.  -     gabapentin (NEURONTIN) 600 MG tablet; Take 1 tablet (600 mg total) by mouth 2 (two) times daily.  Type 2 diabetes mellitus with complication, with long-term current use of insulin (  [place]) A1C is 7.0 today in office. Well controlled on current regimen.  -     CBC with Differential/Platelet -     CMP14+EGFR -     Bayer DCA Hb A1c Waived -     Empagliflozin-linaGLIPtin (GLYXAMBI) 25-5 MG TABS; Take 1 tablet by mouth daily.  Hypertension associated with diabetes ([place]) Well controlled on current regimen.   Hyperlipidemia due to type 2 diabetes mellitus ([place]) Well controlled on current regimen.   Moderate episode of recurrent major depressive disorder (HCC) Well controlled on current regimen.  -     escitalopram (LEXAPRO) 10 MG tablet; Take 1 tablet (10 mg total) by mouth daily.  Generalized anxiety disorder Well controlled on current regimen.  -     escitalopram (LEXAPRO) 10 MG tablet; Take 1 tablet (10 mg total) by mouth daily.  Gastroesophageal reflux disease without esophagitis No alarm signs. Start Nexium 20 mg daily. If no improvement, can increase to 40 mg.  -     esomeprazole (NEXIUM) 20 MG packet; Take 20 mg by mouth daily before breakfast.  Chronic bilateral low back pain without sciatica -     ibuprofen (ADVIL) 800 MG tablet; Take 1 tablet (800 mg total) by mouth every 8 (eight) hours as needed.  Follow-up: 3 months for chronic follow up.   The above assessment and management plan  was discussed with the patient. The patient verbalized understanding of and has agreed to the management plan. Patient is aware to call the clinic if symptoms fail to improve or worsen. Patient is aware when to return to the clinic for a follow-up visit. Patient educated on when it is appropriate to go to the emergency department.   Marjorie Smolder, FNP-C [facility] [address] [place], [place] [address] [phone]

## 2020-08-16 NOTE — Patient Instructions (Signed)

## 2020-08-17 LAB — CBC WITH DIFFERENTIAL/PLATELET
Basophils Absolute: 0 10*3/uL (ref 0.0–0.2)
Basos: 1 %
EOS (ABSOLUTE): 0.1 10*3/uL (ref 0.0–0.4)
Eos: 2 %
Hematocrit: 44.7 % (ref 34.0–46.6)
Hemoglobin: 14.9 g/dL (ref 11.1–15.9)
Immature Grans (Abs): 0 10*3/uL (ref 0.0–0.1)
Immature Granulocytes: 0 %
Lymphocytes Absolute: 2.3 10*3/uL (ref 0.7–3.1)
Lymphs: 29 %
MCH: 31.5 pg (ref 26.6–33.0)
MCHC: 33.3 g/dL (ref 31.5–35.7)
MCV: 95 fL (ref 79–97)
Monocytes Absolute: 0.4 10*3/uL (ref 0.1–0.9)
Monocytes: 5 %
Neutrophils Absolute: 5 10*3/uL (ref 1.4–7.0)
Neutrophils: 63 %
Platelets: 211 10*3/uL (ref 150–450)
RBC: 4.73 x10E6/uL (ref 3.77–5.28)
RDW: 12.1 % (ref 11.7–15.4)
WBC: 7.9 10*3/uL (ref 3.4–10.8)

## 2020-08-17 LAB — CMP14+EGFR
ALT: 6 IU/L (ref 0–32)
AST: 10 IU/L (ref 0–40)
Albumin/Globulin Ratio: 1.7 (ref 1.2–2.2)
Albumin: 3.9 g/dL (ref 3.8–4.9)
Alkaline Phosphatase: 109 IU/L (ref 44–121)
BUN/Creatinine Ratio: 7 — ABNORMAL LOW (ref 12–28)
BUN: 8 mg/dL (ref 8–27)
Bilirubin Total: <0.2 mg/dL (ref 0.0–1.2)
CO2: 20 mmol/L (ref 20–29)
Calcium: 8.7 mg/dL (ref 8.7–10.3)
Chloride: 101 mmol/L (ref 96–106)
Creatinine, Ser: 1.15 mg/dL — ABNORMAL HIGH (ref 0.57–1.00)
GFR calc Af Amer: 60 mL/min/{1.73_m2} (ref >59)
GFR calc non Af Amer: 52 mL/min/{1.73_m2} — ABNORMAL LOW (ref >59)
Globulin, Total: 2.3 g/dL (ref 1.5–4.5)
Glucose: 197 mg/dL — ABNORMAL HIGH (ref 65–99)
Potassium: 4.1 mmol/L (ref 3.5–5.2)
Sodium: 136 mmol/L (ref 134–144)
Total Protein: 6.2 g/dL (ref 6.0–8.5)

## 2020-10-09 ENCOUNTER — Encounter: Payer: Self-pay | Admitting: *Deleted

## 2020-10-10 ENCOUNTER — Other Ambulatory Visit: Payer: Self-pay | Admitting: Family Medicine

## 2020-10-10 DIAGNOSIS — M545 Low back pain, unspecified: Secondary | ICD-10-CM

## 2020-10-10 DIAGNOSIS — G8929 Other chronic pain: Secondary | ICD-10-CM

## 2020-10-11 ENCOUNTER — Other Ambulatory Visit: Payer: Self-pay | Admitting: Family Medicine

## 2020-10-11 DIAGNOSIS — G8929 Other chronic pain: Secondary | ICD-10-CM

## 2020-10-11 DIAGNOSIS — M545 Low back pain, unspecified: Secondary | ICD-10-CM

## 2020-11-09 ENCOUNTER — Ambulatory Visit: Payer: Medicare Other

## 2020-11-16 ENCOUNTER — Encounter: Payer: Self-pay | Admitting: Family Medicine

## 2020-11-16 ENCOUNTER — Ambulatory Visit (INDEPENDENT_AMBULATORY_CARE_PROVIDER_SITE_OTHER): Payer: Medicare Other | Admitting: Family Medicine

## 2020-11-16 ENCOUNTER — Other Ambulatory Visit: Payer: Self-pay

## 2020-11-16 VITALS — BP 132/78 | HR 83 | Temp 96.8°F | Ht 65.0 in | Wt 135.8 lb

## 2020-11-16 DIAGNOSIS — E785 Hyperlipidemia, unspecified: Secondary | ICD-10-CM

## 2020-11-16 DIAGNOSIS — K921 Melena: Secondary | ICD-10-CM

## 2020-11-16 DIAGNOSIS — I2510 Atherosclerotic heart disease of native coronary artery without angina pectoris: Secondary | ICD-10-CM

## 2020-11-16 DIAGNOSIS — E1169 Type 2 diabetes mellitus with other specified complication: Secondary | ICD-10-CM | POA: Diagnosis not present

## 2020-11-16 DIAGNOSIS — N183 Chronic kidney disease, stage 3 unspecified: Secondary | ICD-10-CM

## 2020-11-16 DIAGNOSIS — E1159 Type 2 diabetes mellitus with other circulatory complications: Secondary | ICD-10-CM | POA: Diagnosis not present

## 2020-11-16 DIAGNOSIS — E1122 Type 2 diabetes mellitus with diabetic chronic kidney disease: Secondary | ICD-10-CM

## 2020-11-16 DIAGNOSIS — M546 Pain in thoracic spine: Secondary | ICD-10-CM

## 2020-11-16 DIAGNOSIS — I152 Hypertension secondary to endocrine disorders: Secondary | ICD-10-CM

## 2020-11-16 DIAGNOSIS — I739 Peripheral vascular disease, unspecified: Secondary | ICD-10-CM

## 2020-11-16 DIAGNOSIS — Z794 Long term (current) use of insulin: Secondary | ICD-10-CM

## 2020-11-16 DIAGNOSIS — J410 Simple chronic bronchitis: Secondary | ICD-10-CM

## 2020-11-16 DIAGNOSIS — F411 Generalized anxiety disorder: Secondary | ICD-10-CM

## 2020-11-16 DIAGNOSIS — F331 Major depressive disorder, recurrent, moderate: Secondary | ICD-10-CM

## 2020-11-16 DIAGNOSIS — E118 Type 2 diabetes mellitus with unspecified complications: Secondary | ICD-10-CM

## 2020-11-16 DIAGNOSIS — Z23 Encounter for immunization: Secondary | ICD-10-CM

## 2020-11-16 LAB — BAYER DCA HB A1C WAIVED: HB A1C (BAYER DCA - WAIVED): 9.2 % — ABNORMAL HIGH (ref <7.0)

## 2020-11-16 MED ORDER — ANORO ELLIPTA 62.5-25 MCG/INH IN AEPB
1.0000 | INHALATION_SPRAY | Freq: Every day | RESPIRATORY_TRACT | 3 refills | Status: AC
Start: 2020-11-16 — End: ?

## 2020-11-16 MED ORDER — GLIPIZIDE ER 5 MG PO TB24: 5.0000 mg | ORAL_TABLET | Freq: Every day | ORAL | 2 refills | Status: DC

## 2020-11-16 NOTE — Addendum Note (Signed)
Addended by: Angela Adam on: 11/16/2020 03:51 PM   Modules accepted: Orders

## 2020-11-16 NOTE — Patient Instructions (Signed)
Diabetes Mellitus and Nutrition, Adult When you have diabetes, or diabetes mellitus, it is very important to have healthy eating habits because your blood sugar (glucose) levels are greatly affected by what you eat and drink. Eating healthy foods in the right amounts, at about the same times every day, can help you:  Control your blood glucose.  Lower your risk of heart disease.  Improve your blood pressure.  Reach or maintain a healthy weight. What can affect my meal plan? Every person with diabetes is different, and each person has different needs for a meal plan. Your health care provider may recommend that you work with a dietitian to make a meal plan that is best for you. Your meal plan may vary depending on factors such as:  The calories you need.  The medicines you take.  Your weight.  Your blood glucose, blood pressure, and cholesterol levels.  Your activity level.  Other health conditions you have, such as heart or kidney disease. How do carbohydrates affect me? Carbohydrates, also called carbs, affect your blood glucose level more than any other type of food. Eating carbs naturally raises the amount of glucose in your blood. Carb counting is a method for keeping track of how many carbs you eat. Counting carbs is important to keep your blood glucose at a healthy level, especially if you use insulin or take certain oral diabetes medicines. It is important to know how many carbs you can safely have in each meal. This is different for every person. Your dietitian can help you calculate how many carbs you should have at each meal and for each snack. How does alcohol affect me? Alcohol can cause a sudden decrease in blood glucose (hypoglycemia), especially if you use insulin or take certain oral diabetes medicines. Hypoglycemia can be a life-threatening condition. Symptoms of hypoglycemia, such as sleepiness, dizziness, and confusion, are similar to symptoms of having too much  alcohol.  Do not drink alcohol if: ? Your health care provider tells you not to drink. ? You are pregnant, may be pregnant, or are planning to become pregnant.  If you drink alcohol: ? Do not drink on an empty stomach. ? Limit how much you use to:  0-1 drink a day for women.  0-2 drinks a day for men. ? Be aware of how much alcohol is in your drink. In the [place] one drink equals one 12 oz bottle of beer (355 mL), one 5 oz glass of wine (148 mL), or one 1 oz glass of hard liquor (44 mL). ? Keep yourself hydrated with water, diet soda, or unsweetened iced tea.  Keep in mind that regular soda, juice, and other mixers may contain a lot of sugar and must be counted as carbs. What are tips for following this plan? Reading food labels  Start by checking the serving size on the "Nutrition Facts" label of packaged foods and drinks. The amount of calories, carbs, fats, and other nutrients listed on the label is based on one serving of the item. Many items contain more than one serving per package.  Check the total grams (g) of carbs in one serving. You can calculate the number of servings of carbs in one serving by dividing the total carbs by 15. For example, if a food has 30 g of total carbs per serving, it would be equal to 2 servings of carbs.  Check the number of grams (g) of saturated fats and trans fats in one serving. Choose foods that have   a low amount or none of these fats.  Check the number of milligrams (mg) of salt (sodium) in one serving. Most people should limit total sodium intake to less than 2,300 mg per day.  Always check the nutrition information of foods labeled as "low-fat" or "nonfat." These foods may be higher in added sugar or refined carbs and should be avoided.  Talk to your dietitian to identify your daily goals for nutrients listed on the label. Shopping  Avoid buying canned, pre-made, or processed foods. These foods tend to be high in fat, sodium, and added  sugar.  Shop around the outside edge of the grocery store. This is where you will most often find fresh fruits and vegetables, bulk grains, fresh meats, and fresh dairy. Cooking  Use low-heat cooking methods, such as baking, instead of high-heat cooking methods like deep frying.  Cook using healthy oils, such as olive, canola, or sunflower oil.  Avoid cooking with butter, cream, or high-fat meats. Meal planning  Eat meals and snacks regularly, preferably at the same times every day. Avoid going long periods of time without eating.  Eat foods that are high in fiber, such as fresh fruits, vegetables, beans, and whole grains. Talk with your dietitian about how many servings of carbs you can eat at each meal.  Eat 4-6 oz (112-168 g) of lean protein each day, such as lean meat, chicken, fish, eggs, or tofu. One ounce (oz) of lean protein is equal to: ? 1 oz (28 g) of meat, chicken, or fish. ? 1 egg. ?  cup (62 g) of tofu.  Eat some foods each day that contain healthy fats, such as avocado, nuts, seeds, and fish.   What foods should I eat? Fruits Berries. Apples. Oranges. Peaches. Apricots. Plums. Grapes. Mango. Papaya. Pomegranate. Kiwi. Cherries. Vegetables Lettuce. Spinach. Leafy greens, including kale, chard, collard greens, and mustard greens. Beets. Cauliflower. Cabbage. Broccoli. Carrots. Green beans. Tomatoes. Peppers. Onions. Cucumbers. Brussels sprouts. Grains Whole grains, such as whole-wheat or whole-grain bread, crackers, tortillas, cereal, and pasta. Unsweetened oatmeal. Quinoa. Brown or wild rice. Meats and other proteins Seafood. Poultry without skin. Lean cuts of poultry and beef. Tofu. Nuts. Seeds. Dairy Low-fat or fat-free dairy products such as milk, yogurt, and cheese. The items listed above may not be a complete list of foods and beverages you can eat. Contact a dietitian for more information. What foods should I avoid? Fruits Fruits canned with  syrup. Vegetables Canned vegetables. Frozen vegetables with butter or cream sauce. Grains Refined white flour and flour products such as bread, pasta, snack foods, and cereals. Avoid all processed foods. Meats and other proteins Fatty cuts of meat. Poultry with skin. Breaded or fried meats. Processed meat. Avoid saturated fats. Dairy Full-fat yogurt, cheese, or milk. Beverages Sweetened drinks, such as soda or iced tea. The items listed above may not be a complete list of foods and beverages you should avoid. Contact a dietitian for more information. Questions to ask a health care provider  Do I need to meet with a diabetes educator?  Do I need to meet with a dietitian?  What number can I call if I have questions?  When are the best times to check my blood glucose? Where to find more information:  American Diabetes Association: diabetes.org  Academy of Nutrition and Dietetics: www.eatright.org  National Institute of Diabetes and Digestive and Kidney Diseases: www.niddk.nih.gov  Association of Diabetes Care and Education Specialists: www.diabeteseducator.org Summary  It is important to have healthy eating   habits because your blood sugar (glucose) levels are greatly affected by what you eat and drink.  A healthy meal plan will help you control your blood glucose and maintain a healthy lifestyle.  Your health care provider may recommend that you work with a dietitian to make a meal plan that is best for you.  Keep in mind that carbohydrates (carbs) and alcohol have immediate effects on your blood glucose levels. It is important to count carbs and to use alcohol carefully. This information is not intended to replace advice given to you by your health care provider. Make sure you discuss any questions you have with your health care provider. Document Revised: 09/06/2019 Document Reviewed: 09/06/2019 Elsevier Patient Education  2021 [employer]  

## 2020-11-16 NOTE — Progress Notes (Signed)
Assessment & Plan:  1. Type 2 diabetes mellitus with complication, with long-term current use of insulin (HCC) Lab Results  Component Value Date   HGBA1C 9.2 (H) 11/16/2020   HGBA1C 7.0 (H) 08/16/2020   HGBA1C 9.5 (H) 05/24/2020    - Diabetes is not at goal of A1c < 7. - Medications: continue current medications, add Glipizide XL 5 mg QD - Home glucose monitoring: Continue monitoring - Patient is currently taking a statin. Patient is taking an ACE-inhibitor/ARB.  - Instruction/counseling given: reminded to get eye exam, discussed diet and provided printed educational material  Diabetes Health Maintenance Due  Topic Date Due  . OPHTHALMOLOGY EXAM  Never done  . FOOT EXAM  02/07/2021  . HEMOGLOBIN A1C  05/16/2021    Lab Results  Component Value Date   LABMICR <3.0 10/21/2019   - Lipid panel - Microalbumin / creatinine urine ratio - CBC with Differential/Platelet - CMP14+EGFR - Bayer DCA Hb A1c Waived - glipiZIDE (GLUCOTROL XL) 5 MG 24 hr tablet; Take 1 tablet (5 mg total) by mouth daily with breakfast.  Dispense: 30 tablet; Refill: 2  2. Hyperlipidemia due to type 2 diabetes mellitus ([place]) - Well controlled on current regimen.  - Lipid panel - CMP14+EGFR  3. Coronary artery disease involving native coronary artery of native heart without angina pectoris - On statin. - Lipid panel  4. Hypertension associated with diabetes ([place]) - Well controlled on current regimen.  - Lipid panel - CBC with Differential/Platelet - CMP14+EGFR  5. PVD (peripheral vascular disease) with claudication ([place]) - On statin. - Lipid panel - CMP14+EGFR  6. CKD stage 3 due to type 2 diabetes mellitus (HCC) - CMP14+EGFR  7. Simple chronic bronchitis ([place]) - Uncontrolled without maintenance inhaler.  Refilled Anoro. - umeclidinium-vilanterol (ANORO ELLIPTA) 62.5-25 MCG/INH AEPB; Inhale 1 puff into the lungs daily.  Dispense: 60 each; Refill: 3  8. Acute right-sided thoracic back pain -  Improving.  Continue managing symptoms at home.  9. Blood in stool - Fecal occult blood, imunochemical; Future  10. Moderate episode of recurrent major depressive disorder (HCC) - Well controlled on current regimen.  - CMP14+EGFR  11. Generalized anxiety disorder - Well controlled on current regimen.  - CMP14+EGFR   Return in about 3 months (around 02/13/2021) for follow-up of chronic medication conditions.  Hendricks Limes, MSN, APRN, FNP-C [facility]  Subjective:    Patient ID: Felicia Figueroa, female    DOB: 01-Sep-1961, 60 y.o.   MRN: [id number]  Patient Care Team: Loman Brooklyn, FNP as PCP - General (Family Medicine) Minus Breeding, MD as PCP - Cardiology (Cardiology) Lavera Guise, [facility] (Pharmacist)   Chief Complaint:  Chief Complaint  Patient presents with  . Diabetes    3 month follow up of chronic medical conditions     HPI: Felicia Figueroa is a 60 y.o. female presenting on 11/16/2020 for Diabetes (3 month follow up of chronic medical conditions/)  Diabetes: Patient presents for follow up of diabetes. Current symptoms include: none. Known diabetic complications: peripheral neuropathy and cardiovascular disease. Medication compliance: Yes. Current diet: in general, a "healthy" diet  . Current exercise: none. Home blood sugar records: 120s. Is she  on ACE inhibitor or angiotensin II receptor blocker? Yes. Is she on a statin? Yes.  Patient previously tried Metformin XR which she did take with food, but it still causes diarrhea.  COPD: Patient states she cannot find her Anoro inhaler.   New complaints: Patient reports  she fell a few weeks ago and hit her back on a corner of a cabinet.  She has been taking Epsom salt baths which offer much relief.  She also reports that she had a weekend where she was bleeding out of her bottom.  She states she has not seen any blood since then.  Social history:  Relevant past medical, surgical,  family and social history reviewed and updated as indicated. Interim medical history since our last visit reviewed.  Allergies and medications reviewed and updated.  DATA REVIEWED: CHART IN EPIC  ROS: Negative unless specifically indicated above in HPI.    Current Outpatient Medications:  .  albuterol (VENTOLIN HFA) 108 (90 Base) MCG/ACT inhaler, Inhale 2 puffs into the lungs every 6 (six) hours as needed for wheezing or shortness of breath., Disp: 18 g, Rfl: 2 .  aspirin EC 81 MG tablet, Take 1 tablet (81 mg total) by mouth daily., Disp: 90 tablet, Rfl: 3 .  atorvastatin (LIPITOR) 80 MG tablet, Take 1 tablet (80 mg total) by mouth daily., Disp: 90 tablet, Rfl: 3 .  Blood Glucose Monitoring Suppl (ONETOUCH VERIO REFLECT) w/Device KIT, Use test blood sugar daily in the morning, Disp: 1 kit, Rfl: 1 .  Empagliflozin-linaGLIPtin (GLYXAMBI) 25-5 MG TABS, Take 1 tablet by mouth daily., Disp: 90 tablet, Rfl: 1 .  escitalopram (LEXAPRO) 10 MG tablet, Take 1 tablet (10 mg total) by mouth daily., Disp: 90 tablet, Rfl: 1 .  esomeprazole (NEXIUM) 20 MG packet, Take 20 mg by mouth daily before breakfast., Disp: 30 each, Rfl: 12 .  gabapentin (NEURONTIN) 600 MG tablet, Take 1 tablet (600 mg total) by mouth 2 (two) times daily., Disp: 60 tablet, Rfl: 3 .  glucose blood (ONETOUCH VERIO) test strip, Use to test blood sugar daily as directed. DX: E11.9, Disp: 100 each, Rfl: 12 .  hydrOXYzine (ATARAX/VISTARIL) 25 MG tablet, TAKE 1 TABLET UP TO 3 TIMES A DAY FOR ANXIETY, Disp: 90 tablet, Rfl: 1 .  ibuprofen (ADVIL) 800 MG tablet, Take 1 tablet every 8 (eight) hours as needed., Disp: 90 tablet, Rfl: 0 .  lisinopril-hydrochlorothiazide (ZESTORETIC) 10-12.5 MG tablet, Take 1 tablet by mouth daily., Disp: 90 tablet, Rfl: 3 .  OneTouch Delica Lancets 65L MISC, Use to test blood sugar daily as directed. DX: E11.9, Disp: 100 each, Rfl: 3 .  umeclidinium-vilanterol (ANORO ELLIPTA) 62.5-25 MCG/INH AEPB, Inhale 1 puff into  the lungs daily. Sample given  LOT SD2W, EXP 5/22, Disp: , Rfl:    Allergies  Allergen Reactions  . Cephalexin Nausea And Vomiting  . Hydrocodone-Acetaminophen Itching, Nausea And Vomiting and Rash  . Sulfa Antibiotics Rash   Past Medical History:  Diagnosis Date  . Anxiety   . CAD (coronary artery disease)   . COPD (chronic obstructive pulmonary disease) ([place])   . Depression   . Diabetes ([place])   . History of MI (myocardial infarction)   . Hypertension   . PVD (peripheral vascular disease) ([place])     Past Surgical History:  Procedure Laterality Date  . AMPUTATION TOE    . CARDIAC SURGERY     Stent   . KIDNEY SURGERY     stents placed    Social History   Socioeconomic History  . Marital status: Divorced    Spouse name: Not on file  . Number of children: 2  . Years of education: 10  . Highest education level: 10th grade  Occupational History  . Occupation: Disabled  Tobacco Use  . Smoking  status: Current Every Day Smoker    Packs/day: 1.00    Years: 35.00    Pack years: 35.00    Types: Cigarettes  . Smokeless tobacco: Never Used  Vaping Use  . Vaping Use: Never used  Substance and Sexual Activity  . Alcohol use: Not Currently  . Drug use: Never  . Sexual activity: Not Currently  Other Topics Concern  . Not on file  Social History Narrative   Lives alone.   Two daughters.    Social Determinants of Health   Financial Resource Strain: Not on file  Food Insecurity: Not on file  Transportation Needs: Not on file  Physical Activity: Not on file  Stress: Not on file  Social Connections: Not on file  Intimate Partner Violence: Not on file        Objective:    BP (!) 160/89   Pulse 83   Temp (!) 96.8 F (36 C) (Temporal)   Ht 5' 5"  (1.651 m)   Wt 135 lb 12.8 oz (61.6 kg)   SpO2 100%   BMI 22.60 kg/m   Wt Readings from Last 3 Encounters:  11/16/20 135 lb 12.8 oz (61.6 kg)  08/16/20 131 lb 12.8 oz (59.8 kg)  05/24/20 134 lb 12.8 oz (61.1 kg)     Physical Exam Vitals reviewed.  Constitutional:      General: She is not in acute distress.    Appearance: Normal appearance. She is normal weight. She is not ill-appearing, toxic-appearing or diaphoretic.  HENT:     Head: Normocephalic and atraumatic.  Eyes:     General: No scleral icterus.       Right eye: No discharge.        Left eye: No discharge.     Conjunctiva/sclera: Conjunctivae normal.  Cardiovascular:     Rate and Rhythm: Normal rate and regular rhythm.     Heart sounds: Normal heart sounds. No murmur heard. No friction rub. No gallop.   Pulmonary:     Effort: Pulmonary effort is normal. No respiratory distress.     Breath sounds: No stridor. Wheezing present. No rhonchi or rales.  Musculoskeletal:        General: Normal range of motion.     Cervical back: Normal range of motion.  Skin:    General: Skin is warm and dry.     Capillary Refill: Capillary refill takes less than 2 seconds.  Neurological:     General: No focal deficit present.     Mental Status: She is alert and oriented to person, place, and time. Mental status is at baseline.  Psychiatric:        Mood and Affect: Mood normal.        Behavior: Behavior normal.        Thought Content: Thought content normal.        Judgment: Judgment normal.     Lab Results  Component Value Date   TSH 2.030 10/21/2019   Lab Results  Component Value Date   WBC 7.9 08/16/2020   HGB 14.9 08/16/2020   HCT 44.7 08/16/2020   MCV 95 08/16/2020   PLT 211 08/16/2020   Lab Results  Component Value Date   NA 136 08/16/2020   K 4.1 08/16/2020   CO2 20 08/16/2020   GLUCOSE 197 (H) 08/16/2020   BUN 8 08/16/2020   CREATININE 1.15 (H) 08/16/2020   BILITOT <0.2 08/16/2020   ALKPHOS 109 08/16/2020   AST 10 08/16/2020   ALT 6 08/16/2020  PROT 6.2 08/16/2020   ALBUMIN 3.9 08/16/2020   CALCIUM 8.7 08/16/2020   ANIONGAP 9 02/19/2020   Lab Results  Component Value Date   CHOL 128 02/08/2020   Lab Results   Component Value Date   HDL 25 (L) 02/08/2020   Lab Results  Component Value Date   LDLCALC 69 02/08/2020   Lab Results  Component Value Date   TRIG 203 (H) 02/08/2020   Lab Results  Component Value Date   CHOLHDL 5.1 (H) 02/08/2020   Lab Results  Component Value Date   HGBA1C 7.0 (H) 08/16/2020

## 2020-11-17 LAB — CMP14+EGFR
ALT: 10 IU/L (ref 0–32)
AST: 15 IU/L (ref 0–40)
Albumin/Globulin Ratio: 1.6 (ref 1.2–2.2)
Albumin: 4.2 g/dL (ref 3.8–4.9)
Alkaline Phosphatase: 118 IU/L (ref 44–121)
BUN/Creatinine Ratio: 6 — ABNORMAL LOW (ref 12–28)
BUN: 7 mg/dL — ABNORMAL LOW (ref 8–27)
Bilirubin Total: 0.2 mg/dL (ref 0.0–1.2)
CO2: 20 mmol/L (ref 20–29)
Calcium: 8.9 mg/dL (ref 8.7–10.3)
Chloride: 103 mmol/L (ref 96–106)
Creatinine, Ser: 1.17 mg/dL — ABNORMAL HIGH (ref 0.57–1.00)
GFR calc Af Amer: 59 mL/min/{1.73_m2} — ABNORMAL LOW (ref >59)
GFR calc non Af Amer: 51 mL/min/{1.73_m2} — ABNORMAL LOW (ref >59)
Globulin, Total: 2.7 g/dL (ref 1.5–4.5)
Glucose: 196 mg/dL — ABNORMAL HIGH (ref 65–99)
Potassium: 4.1 mmol/L (ref 3.5–5.2)
Sodium: 138 mmol/L (ref 134–144)
Total Protein: 6.9 g/dL (ref 6.0–8.5)

## 2020-11-17 LAB — CBC WITH DIFFERENTIAL/PLATELET
Basophils Absolute: 0.1 10*3/uL (ref 0.0–0.2)
Basos: 1 %
EOS (ABSOLUTE): 0.1 10*3/uL (ref 0.0–0.4)
Eos: 2 %
Hematocrit: 47.7 % — ABNORMAL HIGH (ref 34.0–46.6)
Hemoglobin: 16 g/dL — ABNORMAL HIGH (ref 11.1–15.9)
Immature Grans (Abs): 0 10*3/uL (ref 0.0–0.1)
Immature Granulocytes: 1 %
Lymphocytes Absolute: 2.7 10*3/uL (ref 0.7–3.1)
Lymphs: 34 %
MCH: 29.4 pg (ref 26.6–33.0)
MCHC: 33.5 g/dL (ref 31.5–35.7)
MCV: 88 fL (ref 79–97)
Monocytes Absolute: 0.5 10*3/uL (ref 0.1–0.9)
Monocytes: 6 %
Neutrophils Absolute: 4.5 10*3/uL (ref 1.4–7.0)
Neutrophils: 56 %
Platelets: 217 10*3/uL (ref 150–450)
RBC: 5.44 x10E6/uL — ABNORMAL HIGH (ref 3.77–5.28)
RDW: 12.1 % (ref 11.7–15.4)
WBC: 7.9 10*3/uL (ref 3.4–10.8)

## 2020-11-17 LAB — LIPID PANEL
Chol/HDL Ratio: 8.4 ratio — ABNORMAL HIGH (ref 0.0–4.4)
Cholesterol, Total: 236 mg/dL — ABNORMAL HIGH (ref 100–199)
HDL: 28 mg/dL — ABNORMAL LOW (ref >39)
LDL Chol Calc (NIH): 159 mg/dL — ABNORMAL HIGH (ref 0–99)
Triglycerides: 262 mg/dL — ABNORMAL HIGH (ref 0–149)
VLDL Cholesterol Cal: 49 mg/dL — ABNORMAL HIGH (ref 5–40)

## 2020-11-17 LAB — MICROALBUMIN / CREATININE URINE RATIO
Creatinine, Urine: 14.5 mg/dL
Microalb/Creat Ratio: 33 mg/g creat — ABNORMAL HIGH (ref 0–29)
Microalbumin, Urine: 4.8 ug/mL

## 2020-11-19 ENCOUNTER — Encounter: Payer: Self-pay | Admitting: Family Medicine

## 2020-11-19 DIAGNOSIS — E1121 Type 2 diabetes mellitus with diabetic nephropathy: Secondary | ICD-10-CM | POA: Insufficient documentation

## 2020-11-20 ENCOUNTER — Other Ambulatory Visit: Payer: Self-pay | Admitting: Family Medicine

## 2020-11-20 DIAGNOSIS — E1169 Type 2 diabetes mellitus with other specified complication: Secondary | ICD-10-CM

## 2020-11-20 DIAGNOSIS — E785 Hyperlipidemia, unspecified: Secondary | ICD-10-CM

## 2020-11-20 MED ORDER — EZETIMIBE 10 MG PO TABS: 10.0000 mg | ORAL_TABLET | Freq: Every day | ORAL | 2 refills | Status: DC

## 2020-11-27 ENCOUNTER — Other Ambulatory Visit: Payer: Self-pay | Admitting: Family Medicine

## 2020-11-27 ENCOUNTER — Ambulatory Visit (INDEPENDENT_AMBULATORY_CARE_PROVIDER_SITE_OTHER): Payer: Medicare Other | Admitting: *Deleted

## 2020-11-27 VITALS — BP 135/70 | Ht 65.0 in | Wt 135.0 lb

## 2020-11-27 DIAGNOSIS — Z0000 Encounter for general adult medical examination without abnormal findings: Secondary | ICD-10-CM | POA: Diagnosis not present

## 2020-11-27 DIAGNOSIS — M545 Low back pain, unspecified: Secondary | ICD-10-CM

## 2020-11-27 DIAGNOSIS — G8929 Other chronic pain: Secondary | ICD-10-CM

## 2020-11-27 NOTE — Progress Notes (Signed)
MEDICARE ANNUAL WELLNESS VISIT  11/27/2020  Telephone Visit Disclaimer This Medicare AWV was conducted by telephone due to national recommendations for restrictions regarding the COVID-19 Pandemic (e.g. social distancing).  I verified, using two identifiers, that I am speaking with Myrtha Mantis or their authorized healthcare agent. I discussed the limitations, risks, security, and privacy concerns of performing an evaluation and management service by telephone and the potential availability of an in-person appointment in the future. The patient expressed understanding and agreed to proceed.  Location of Patient: in her car Location of Provider (nurse):  In office  Subjective:    LATERRIA LASOTA is a 60 y.o. female patient of Loman Brooklyn, FNP who had a Medicare Annual Wellness Visit today via telephone. Lafreda is Disabled and lives alone. she has 2 children. she reports that she is socially active and does interact with friends/family regularly. she is minimally physically active and enjoys crafts, painting and drawing.  Patient Care Team: Loman Brooklyn, FNP as PCP - General (Family Medicine) Minus Breeding, MD as PCP - Cardiology (Cardiology) Lavera Guise, [facility] (Pharmacist)  Advanced Directives 11/27/2020 02/19/2020 11/09/2019  Does Patient Have a Medical Advance Directive? No No No  Would patient like information on creating a medical advance directive? No - Patient declined No - Patient declined No - Patient declined    Hospital Utilization Over the Past 12 Months: # of hospitalizations or ER visits: 0 # of surgeries: 0  Review of Systems    Patient reports that her overall health is unchanged compared to last year.  General ROS: negative  Patient Reported Readings (BP, Pulse, CBG, Weight, etc) BP 135/70   Ht 5' 5"  (1.651 m)   Wt 135 lb (61.2 kg)   BMI 22.47 kg/m    Pain Assessment       Current Medications & Allergies (verified) Allergies  as of 11/27/2020      Reactions   Cephalexin Nausea And Vomiting   Hydrocodone-acetaminophen Itching, Nausea And Vomiting, Rash   Sulfa Antibiotics Rash   Metformin And Related Diarrhea      Medication List       Accurate as of November 27, 2020  3:45 PM. If you have any questions, ask your nurse or doctor.        albuterol 108 (90 Base) MCG/ACT inhaler Commonly known as: VENTOLIN HFA Inhale 2 puffs into the lungs every 6 (six) hours as needed for wheezing or shortness of breath.   Anoro Ellipta 62.5-25 MCG/INH Aepb Generic drug: umeclidinium-vilanterol Inhale 1 puff into the lungs daily.   aspirin EC 81 MG tablet Take 1 tablet (81 mg total) by mouth daily.   atorvastatin 80 MG tablet Commonly known as: LIPITOR Take 1 tablet (80 mg total) by mouth daily.   escitalopram 10 MG tablet Commonly known as: Lexapro Take 1 tablet (10 mg total) by mouth daily.   esomeprazole 20 MG packet Commonly known as: NexIUM Take 20 mg by mouth daily before breakfast.   ezetimibe 10 MG tablet Commonly known as: Zetia Take 1 tablet (10 mg total) by mouth daily.   gabapentin 600 MG tablet Commonly known as: NEURONTIN Take 1 tablet (600 mg total) by mouth 2 (two) times daily.   glipiZIDE 5 MG 24 hr tablet Commonly known as: GLUCOTROL XL Take 1 tablet (5 mg total) by mouth daily with breakfast.   Glyxambi 25-5 MG Tabs Generic drug: Empagliflozin-linaGLIPtin Take 1 tablet by mouth daily.   hydrOXYzine 25  MG tablet Commonly known as: ATARAX/VISTARIL TAKE 1 TABLET UP TO 3 TIMES A DAY FOR ANXIETY   ibuprofen 800 MG tablet Commonly known as: ADVIL Take 1 tablet every 8 (eight) hours as needed.   lisinopril-hydrochlorothiazide 10-12.5 MG tablet Commonly known as: ZESTORETIC Take 1 tablet by mouth daily.   OneTouch Delica Lancets 41R Misc Use to test blood sugar daily as directed. DX: E11.9   OneTouch Verio Reflect w/Device Kit Use test blood sugar daily in the morning    OneTouch Verio test strip Generic drug: glucose blood Use to test blood sugar daily as directed. DX: E11.9       History (reviewed): Past Medical History:  Diagnosis Date  . Anxiety   . CAD (coronary artery disease)   . COPD (chronic obstructive pulmonary disease) ([place])   . Depression   . Diabetes ([place])   . Diabetic nephropathy ([place])   . History of MI (myocardial infarction)   . Hypertension   . PVD (peripheral vascular disease) ([place])    Past Surgical History:  Procedure Laterality Date  . AMPUTATION TOE    . CARDIAC SURGERY     Stent   . KIDNEY SURGERY     stents placed   Family History  Problem Relation Age of Onset  . Depression Mother   . Stroke Mother   . COPD Father   . Cirrhosis Father   . Diabetes Sister   . Anxiety disorder Sister   . Tremor Sister   . Congestive Heart Failure Sister        No details, sudden death  . Obesity Daughter    Social History   Socioeconomic History  . Marital status: Divorced    Spouse name: Not on file  . Number of children: 2  . Years of education: 10  . Highest education level: 10th grade  Occupational History  . Occupation: Disabled  Tobacco Use  . Smoking status: Current Every Day Smoker    Packs/day: 1.00    Years: 35.00    Pack years: 35.00    Types: Cigarettes  . Smokeless tobacco: Never Used  Vaping Use  . Vaping Use: Never used  Substance and Sexual Activity  . Alcohol use: Not Currently  . Drug use: Never  . Sexual activity: Not Currently  Other Topics Concern  . Not on file  Social History Narrative   Lives alone.   Two daughters.    Social Determinants of Health   Financial Resource Strain: Not on file  Food Insecurity: Not on file  Transportation Needs: Not on file  Physical Activity: Not on file  Stress: Not on file  Social Connections: Not on file    Activities of Daily Living In your present state of health, do you have any difficulty performing the following activities: 11/27/2020   Hearing? N  Vision? Y  Comment readers  Difficulty concentrating or making decisions? N  Walking or climbing stairs? Y  Comment back pain  Dressing or bathing? N  Doing errands, shopping? N  Preparing Food and eating ? N  Using the Toilet? N  In the past six months, have you accidently leaked urine? N  Do you have problems with loss of bowel control? N  Managing your Medications? N  Managing your Finances? N  Housekeeping or managing your Housekeeping? N  Some recent data might be hidden    Patient Education/ Literacy    Exercise Current Exercise Habits: Home exercise routine, Type of exercise: walking, Time (Minutes): 15,  Frequency (Times/Week): 3, Weekly Exercise (Minutes/Week): 45, Intensity: Mild, Exercise limited by: None identified  Diet Patient reports consuming 2 meals a day and 1 snack(s) a day Patient reports that her primary diet is: Regular Patient reports that she does have regular access to food.   Depression Screen PHQ 2/9 Scores 11/27/2020 11/16/2020 08/16/2020 07/05/2020 05/24/2020 03/23/2020 02/08/2020  PHQ - 2 Score 1 3 0 0 2 2 5   PHQ- 9 Score - 11 - 0 7 15 17      Fall Risk Fall Risk  11/27/2020 11/16/2020 08/16/2020 05/24/2020 03/23/2020  Falls in the past year? 1 1 0 0 0  Number falls in past yr: 0 0 - 0 -  Injury with Fall? 1 1 - 0 -  Risk for fall due to : Impaired mobility - - - -  Follow up Falls evaluation completed Falls prevention discussed - Falls evaluation completed -     Objective:  SHAWNIA VIZCARRONDO seemed alert and oriented and she participated appropriately during our telephone visit.  Blood Pressure Weight BMI  BP Readings from Last 3 Encounters:  11/27/20 135/70  11/16/20 132/78  08/16/20 124/80   Wt Readings from Last 3 Encounters:  11/27/20 135 lb (61.2 kg)  11/16/20 135 lb 12.8 oz (61.6 kg)  08/16/20 131 lb 12.8 oz (59.8 kg)   BMI Readings from Last 1 Encounters:  11/27/20 22.47 kg/m    *Unable to obtain current vital signs,  weight, and BMI due to telephone visit type  Hearing/Vision  . [place] did not seem to have difficulty with hearing/understanding during the telephone conversation . Reports that she has not had a formal eye exam by an eye care professional within the past year . Reports that she has not had a formal hearing evaluation within the past year *Unable to fully assess hearing and vision during telephone visit type  Cognitive Function: 6CIT Screen 11/27/2020 11/09/2019  What Year? 0 points 0 points  What month? 0 points 0 points  What time? 0 points 0 points  Count back from 20 0 points 0 points  Months in reverse 0 points 4 points  Repeat phrase 0 points 2 points  Total Score 0 6   (Normal:0-7, Significant for Dysfunction: >8)  Normal Cognitive Function Screening: Yes   Immunization & Health Maintenance Record Immunization History  Administered Date(s) Administered  . Influenza,inj,quad, With Preservative 09/10/2015  . Pneumococcal Conjugate-13 07/22/2018  . Pneumococcal Polysaccharide-23 09/10/2015  . Td 02/05/1999  . Tdap 11/16/2020    Health Maintenance  Topic Date Due  . OPHTHALMOLOGY EXAM  Never done  . PAP SMEAR-Modifier  Never done  . COVID-19 Vaccine (1) 12/02/2020 (Originally 06/12/1966)  . INFLUENZA VACCINE  01/10/2021 (Originally 05/13/2020)  . Fecal DNA (Cologuard)  08/16/2021 (Originally 06/12/2010)  . MAMMOGRAM  11/16/2021 (Originally 11/13/2020)  . Hepatitis C Screening  11/27/2021 (Originally 1961/06/26)  . HIV Screening  11/27/2021 (Originally 06/13/1975)  . FOOT EXAM  02/07/2021  . HEMOGLOBIN A1C  05/16/2021  . TETANUS/TDAP  11/16/2030  . PNEUMOCOCCAL POLYSACCHARIDE VACCINE AGE 70-64 HIGH RISK  Completed       Assessment  This is a routine wellness examination for [employer].  Health Maintenance: Due or Overdue Health Maintenance Due  Topic Date Due  . OPHTHALMOLOGY EXAM  Never done  . PAP SMEAR-Modifier  Never done    Myrtha Mantis does  not need a referral for Community Assistance: Care Management:   no Social Work:    no Prescription Assistance:  no Nutrition/Diabetes Education:  no   Plan:  Personalized Goals Goals Addressed            This Visit's Progress   . DIET - INCREASE WATER INTAKE   On track    Try to drink 6-8 glasses of water daily      Personalized Health Maintenance & Screening Recommendations  Screening Pap smear and pelvic exam  eye appt   Lung Cancer Screening Recommended: no (Low Dose CT Chest recommended if Age 71-80 years, 30 pack-year currently smoking OR have quit w/in past 15 years) Hepatitis C Screening recommended: no HIV Screening recommended: no  Advanced Directives: Written information was not prepared per patient's request.  Referrals & Orders No orders of the defined types were placed in this encounter.   Follow-up Plan . Follow-up with Loman Brooklyn, FNP as planned    I have personally reviewed and noted the following in the patient's chart:   . Medical and social history . Use of alcohol, tobacco or illicit drugs  . Current medications and supplements . Functional ability and status . Nutritional status . Physical activity . Advanced directives . List of other physicians . Hospitalizations, surgeries, and ER visits in previous 12 months . Vitals . Screenings to include cognitive, depression, and falls . Referrals and appointments  In addition, I have reviewed and discussed with Myrtha Mantis certain preventive protocols, quality metrics, and best practice recommendations. A written personalized care plan for preventive services as well as general preventive health recommendations is available and can be mailed to the patient at her request.      [place] Dec  11/27/2020

## 2020-11-27 NOTE — Patient Instructions (Signed)
  Felicia Figueroa , Thank you for taking time to come for your Medicare Wellness Visit. I appreciate your ongoing commitment to your health goals. Please review the following plan we discussed and let me know if I can assist you in the future.   These are the goals we discussed: Goals    . DIET - INCREASE WATER INTAKE     Try to drink 6-8 glasses of water daily       This is a list of the screening recommended for you and due dates:  Health Maintenance  Topic Date Due  . Eye exam for diabetics  Never done  . Pap Smear  Never done  . COVID-19 Vaccine (1) 12/02/2020*  . Flu Shot  01/10/2021*  . Cologuard (Stool DNA test)  08/16/2021*  . Mammogram  11/16/2021*  .  Hepatitis C: One time screening is recommended by Center for Disease Control  (CDC) for  adults born from [birth year] through 1966.   11/27/2021*  . HIV Screening  11/27/2021*  . Complete foot exam   02/07/2021  . Hemoglobin A1C  05/16/2021  . Tetanus Vaccine  11/16/2030  . Pneumococcal vaccine  Completed  *Topic was postponed. The date shown is not the original due date.

## 2020-12-21 ENCOUNTER — Other Ambulatory Visit: Payer: Self-pay | Admitting: Family Medicine

## 2021-01-18 ENCOUNTER — Other Ambulatory Visit: Payer: Self-pay | Admitting: Family Medicine

## 2021-01-18 DIAGNOSIS — E1142 Type 2 diabetes mellitus with diabetic polyneuropathy: Secondary | ICD-10-CM

## 2021-02-14 ENCOUNTER — Ambulatory Visit: Payer: Medicare Other | Admitting: Family Medicine

## 2021-02-19 ENCOUNTER — Ambulatory Visit (INDEPENDENT_AMBULATORY_CARE_PROVIDER_SITE_OTHER): Payer: Medicare Other | Admitting: Family Medicine

## 2021-02-19 ENCOUNTER — Other Ambulatory Visit: Payer: Self-pay

## 2021-02-19 ENCOUNTER — Other Ambulatory Visit: Payer: Self-pay | Admitting: Family Medicine

## 2021-02-19 ENCOUNTER — Encounter: Payer: Self-pay | Admitting: Family Medicine

## 2021-02-19 VITALS — BP 111/72 | HR 75 | Temp 97.6°F | Ht 65.0 in | Wt 133.6 lb

## 2021-02-19 DIAGNOSIS — S98111D Complete traumatic amputation of right great toe, subsequent encounter: Secondary | ICD-10-CM

## 2021-02-19 DIAGNOSIS — E785 Hyperlipidemia, unspecified: Secondary | ICD-10-CM

## 2021-02-19 DIAGNOSIS — S98111A Complete traumatic amputation of right great toe, initial encounter: Secondary | ICD-10-CM

## 2021-02-19 DIAGNOSIS — E118 Type 2 diabetes mellitus with unspecified complications: Secondary | ICD-10-CM | POA: Diagnosis not present

## 2021-02-19 DIAGNOSIS — E1169 Type 2 diabetes mellitus with other specified complication: Secondary | ICD-10-CM

## 2021-02-19 DIAGNOSIS — F331 Major depressive disorder, recurrent, moderate: Secondary | ICD-10-CM

## 2021-02-19 DIAGNOSIS — L602 Onychogryphosis: Secondary | ICD-10-CM

## 2021-02-19 DIAGNOSIS — E1159 Type 2 diabetes mellitus with other circulatory complications: Secondary | ICD-10-CM | POA: Diagnosis not present

## 2021-02-19 DIAGNOSIS — G8929 Other chronic pain: Secondary | ICD-10-CM

## 2021-02-19 DIAGNOSIS — I152 Hypertension secondary to endocrine disorders: Secondary | ICD-10-CM

## 2021-02-19 DIAGNOSIS — M545 Low back pain, unspecified: Secondary | ICD-10-CM

## 2021-02-19 DIAGNOSIS — F411 Generalized anxiety disorder: Secondary | ICD-10-CM

## 2021-02-19 DIAGNOSIS — Z794 Long term (current) use of insulin: Secondary | ICD-10-CM

## 2021-02-19 DIAGNOSIS — E1142 Type 2 diabetes mellitus with diabetic polyneuropathy: Secondary | ICD-10-CM

## 2021-02-19 LAB — BAYER DCA HB A1C WAIVED: HB A1C (BAYER DCA - WAIVED): 8 % — ABNORMAL HIGH (ref <7.0)

## 2021-02-19 MED ORDER — LISINOPRIL-HYDROCHLOROTHIAZIDE 10-12.5 MG PO TABS: 1.0000 | ORAL_TABLET | Freq: Every day | ORAL | 1 refills | Status: DC

## 2021-02-19 MED ORDER — GLIPIZIDE ER 10 MG PO TB24: 10.0000 mg | ORAL_TABLET | Freq: Every day | ORAL | 1 refills | Status: DC

## 2021-02-19 MED ORDER — HYDROXYZINE HCL 25 MG PO TABS: ORAL_TABLET | ORAL | 1 refills | Status: DC

## 2021-02-19 MED ORDER — GLYXAMBI 25-5 MG PO TABS: 1.0000 | ORAL_TABLET | Freq: Every day | ORAL | 1 refills | Status: DC

## 2021-02-19 MED ORDER — ESCITALOPRAM OXALATE 20 MG PO TABS: 20.0000 mg | ORAL_TABLET | Freq: Every day | ORAL | 1 refills | Status: DC

## 2021-02-19 MED ORDER — GABAPENTIN 600 MG PO TABS: 600.0000 mg | ORAL_TABLET | Freq: Two times a day (BID) | ORAL | 1 refills | Status: DC

## 2021-02-19 NOTE — Telephone Encounter (Signed)
Last office visit 02/19/21 Last refill 11/28/20, #90, 1 refill

## 2021-02-19 NOTE — Progress Notes (Signed)
Assessment & Plan:  1. Hyperlipidemia due to type 2 diabetes mellitus ([place]) Previously uncontrolled at which time Zetia was added. Labs today to assess for improvement. - Lipid panel - CMP14+EGFR  2. Type 2 diabetes mellitus with complication, with long-term current use of insulin (HCC) Lab Results  Component Value Date   HGBA1C 8.0 (H) 02/19/2021   HGBA1C 9.2 (H) 11/16/2020   HGBA1C 7.0 (H) 08/16/2020    - Diabetes is not at goal of A1c < 7, but is improving. - Medications: Continue Glyxambi. Increase Glipizide from 5 mg to 10 mg once daily. - Home glucose monitoring: continue monitoring. - Patient is currently taking a statin. Patient is taking an ACE-inhibitor/ARB.  - Instruction/counseling given: discussed foot care  Diabetes Health Maintenance Due  Topic Date Due  . OPHTHALMOLOGY EXAM  Never done  . HEMOGLOBIN A1C  08/22/2021  . FOOT EXAM  02/19/2022    - Lipid panel - CBC with Differential/Platelet - CMP14+EGFR - Bayer DCA Hb A1c Waived - Empagliflozin-linaGLIPtin (GLYXAMBI) 25-5 MG TABS; Take 1 tablet by mouth daily.  Dispense: 90 tablet; Refill: 1 - glipiZIDE (GLUCOTROL XL) 10 MG 24 hr tablet; Take 1 tablet (10 mg total) by mouth daily with breakfast.  Dispense: 90 tablet; Refill: 1 - Ambulatory referral to Podiatry  3. Hypertension associated with diabetes ([place]) Well controlled on current regimen.  - Lipid panel - CBC with Differential/Platelet - CMP14+EGFR - lisinopril-hydrochlorothiazide (ZESTORETIC) 10-12.5 MG tablet; Take 1 tablet by mouth daily.  Dispense: 90 tablet; Refill: 1  4. Generalized anxiety disorder Uncontrolled. Lexapro increased from 10 mg to 20 mg once daily.  - CMP14+EGFR - hydrOXYzine (ATARAX/VISTARIL) 25 MG tablet; TAKE 1 TABLET UP TO 3 TIMES A DAY FOR ANXIETY  Dispense: 90 tablet; Refill: 1 - escitalopram (LEXAPRO) 20 MG tablet; Take 1 tablet (20 mg total) by mouth daily.  Dispense: 90 tablet; Refill: 1  5. Moderate episode of recurrent  major depressive disorder (HCC) Uncontrolled. Lexapro increased from 10 mg to 20 mg once daily.  - CMP14+EGFR - escitalopram (LEXAPRO) 20 MG tablet; Take 1 tablet (20 mg total) by mouth daily.  Dispense: 90 tablet; Refill: 1  6. Diabetic polyneuropathy associated with type 2 diabetes mellitus ([place]) Well controlled on current regimen.  - CMP14+EGFR - gabapentin (NEURONTIN) 600 MG tablet; Take 1 tablet (600 mg total) by mouth 2 (two) times daily.  Dispense: 180 tablet; Refill: 1  7. Amputation of right great toe [facility]) - Ambulatory referral for Orthotics  8. Thickened nails - Ambulatory referral to Podiatry   Return in about 6 weeks (around 04/02/2021) for Anxiety (may be telephone) and 3 months DM.  Hendricks Limes, MSN, APRN, FNP-C [facility]  Subjective:    Patient ID: Felicia Figueroa, female    DOB: 04-19-1961, 60 y.o.   MRN: [id number]  Patient Care Team: Loman Brooklyn, FNP as PCP - General (Family Medicine) Minus Breeding, MD as PCP - Cardiology (Cardiology) Lavera Guise, [facility] (Pharmacist)   Chief Complaint:  Chief Complaint  Patient presents with  . Diabetes  . Hypertension    3 month follow up of chronic medical conditions     HPI: Felicia Figueroa is a 60 y.o. female presenting on 02/19/2021 for Diabetes and Hypertension (3 month follow up of chronic medical conditions )  Diabetes: Patient presents for follow up of diabetes. Current symptoms include: none. Known diabetic complications: peripheral neuropathy and cardiovascular disease. Medication compliance: Yes. Current diet: in general, a "healthy"  diet  . Current exercise: walking. Home blood sugar records: 120s. Is she  on ACE inhibitor or angiotensin II receptor blocker? Yes. Is she on a statin? Yes.  Patient previously tried Metformin XR which she did take with food, but it still causes diarrhea.  COPD: Patient states she is doing much better now that she has her Anoro inhaler.  She does not use the Albuterol inhaler often (<2x/week).   Anxiety/Depression: patient is wondering if her Lexapro can be increased.  Depression screen [facility] 2/9 02/19/2021 11/27/2020 11/16/2020  Decreased Interest 1 0 2  Down, Depressed, Hopeless 1 1 1   PHQ - 2 Score 2 1 3   Altered sleeping 1 - 3  Tired, decreased energy 1 - 3  Change in appetite 2 - 2  Feeling bad or failure about yourself  2 - 0  Trouble concentrating 1 - 0  Moving slowly or fidgety/restless 3 - 0  Suicidal thoughts 0 - 0  PHQ-9 Score 12 - 11  Difficult doing work/chores Somewhat difficult - Somewhat difficult  Some recent data might be hidden   GAD 7 : Generalized Anxiety Score 02/19/2021 11/16/2020 07/05/2020 05/24/2020  Nervous, Anxious, on Edge 3 3 0 3  Control/stop worrying 1 0 0 3  Worry too much - different things 3 2 0 3  Trouble relaxing 3 3 0 3  Restless 3 3 0 3  Easily annoyed or irritable 1 3 0 2  Afraid - awful might happen 1 1 0 2  Total GAD 7 Score 15 15 0 19  Anxiety Difficulty Somewhat difficult Somewhat difficult Not difficult at all Somewhat difficult   New complaints: None  Social history:  Relevant past medical, surgical, family and social history reviewed and updated as indicated. Interim medical history since our last visit reviewed.  Allergies and medications reviewed and updated.  DATA REVIEWED: CHART IN EPIC  ROS: Negative unless specifically indicated above in HPI.    Current Outpatient Medications:  .  albuterol (VENTOLIN HFA) 108 (90 Base) MCG/ACT inhaler, Inhale 2 puffs into the lungs every 6 (six) hours as needed for wheezing or shortness of breath., Disp: 18 g, Rfl: 2 .  aspirin EC 81 MG tablet, Take 1 tablet (81 mg total) by mouth daily., Disp: 90 tablet, Rfl: 3 .  atorvastatin (LIPITOR) 80 MG tablet, Take 1 tablet (80 mg total) by mouth daily., Disp: 90 tablet, Rfl: 3 .  Blood Glucose Monitoring Suppl (ONETOUCH VERIO REFLECT) w/Device KIT, Use test blood sugar daily in the  morning, Disp: 1 kit, Rfl: 1 .  Empagliflozin-linaGLIPtin (GLYXAMBI) 25-5 MG TABS, Take 1 tablet by mouth daily., Disp: 90 tablet, Rfl: 1 .  escitalopram (LEXAPRO) 10 MG tablet, Take 1 tablet (10 mg total) by mouth daily., Disp: 90 tablet, Rfl: 1 .  esomeprazole (NEXIUM) 20 MG packet, Take 20 mg by mouth daily before breakfast., Disp: 30 each, Rfl: 12 .  ezetimibe (ZETIA) 10 MG tablet, Take 1 tablet (10 mg total) by mouth daily., Disp: 30 tablet, Rfl: 2 .  gabapentin (NEURONTIN) 600 MG tablet, TAKE 1 TABLET 2 TIMES A DAY, Disp: 60 tablet, Rfl: 0 .  glipiZIDE (GLUCOTROL XL) 5 MG 24 hr tablet, Take 1 tablet (5 mg total) by mouth daily with breakfast., Disp: 30 tablet, Rfl: 2 .  glucose blood (ONETOUCH VERIO) test strip, Use to test blood sugar daily as directed. DX: E11.9, Disp: 100 each, Rfl: 12 .  hydrOXYzine (ATARAX/VISTARIL) 25 MG tablet, TAKE 1 TABLET UP TO 3 TIMES  A DAY FOR ANXIETY, Disp: 90 tablet, Rfl: 1 .  ibuprofen (ADVIL) 800 MG tablet, TAKE 1 TABLET EVERY 8 HOURS AS NEEDED, Disp: 90 tablet, Rfl: 1 .  lisinopril-hydrochlorothiazide (ZESTORETIC) 10-12.5 MG tablet, Take 1 tablet by mouth daily., Disp: 90 tablet, Rfl: 3 .  OneTouch Delica Lancets 01U MISC, Use to test blood sugar daily as directed. DX: E11.9, Disp: 100 each, Rfl: 3 .  umeclidinium-vilanterol (ANORO ELLIPTA) 62.5-25 MCG/INH AEPB, Inhale 1 puff into the lungs daily., Disp: 60 each, Rfl: 3   Allergies  Allergen Reactions  . Cephalexin Nausea And Vomiting  . Hydrocodone-Acetaminophen Itching, Nausea And Vomiting and Rash  . Sulfa Antibiotics Rash  . Metformin And Related Diarrhea   Past Medical History:  Diagnosis Date  . Anxiety   . CAD (coronary artery disease)   . COPD (chronic obstructive pulmonary disease) ([place])   . Depression   . Diabetes ([place])   . Diabetic nephropathy ([place])   . History of MI (myocardial infarction)   . Hypertension   . PVD (peripheral vascular disease) ([place])     Past Surgical History:   Procedure Laterality Date  . AMPUTATION TOE    . CARDIAC SURGERY     Stent   . KIDNEY SURGERY     stents placed    Social History   Socioeconomic History  . Marital status: Divorced    Spouse name: Not on file  . Number of children: 2  . Years of education: 10  . Highest education level: 10th grade  Occupational History  . Occupation: Disabled  Tobacco Use  . Smoking status: Current Every Day Smoker    Packs/day: 1.00    Years: 35.00    Pack years: 35.00    Types: Cigarettes  . Smokeless tobacco: Never Used  Vaping Use  . Vaping Use: Never used  Substance and Sexual Activity  . Alcohol use: Not Currently  . Drug use: Never  . Sexual activity: Not Currently  Other Topics Concern  . Not on file  Social History Narrative   Lives alone.   Two daughters.    Social Determinants of Health   Financial Resource Strain: Not on file  Food Insecurity: Not on file  Transportation Needs: Not on file  Physical Activity: Not on file  Stress: Not on file  Social Connections: Not on file  Intimate Partner Violence: Not on file        Objective:    BP 111/72   Pulse 75   Temp 97.6 F (36.4 C) (Temporal)   Ht 5' 5"  (1.651 m)   Wt 133 lb 9.6 oz (60.6 kg)   SpO2 99%   BMI 22.23 kg/m   Wt Readings from Last 3 Encounters:  02/19/21 133 lb 9.6 oz (60.6 kg)  11/27/20 135 lb (61.2 kg)  11/16/20 135 lb 12.8 oz (61.6 kg)    Physical Exam Vitals reviewed.  Constitutional:      General: She is not in acute distress.    Appearance: Normal appearance. She is normal weight. She is not ill-appearing, toxic-appearing or diaphoretic.  HENT:     Head: Normocephalic and atraumatic.  Eyes:     General: No scleral icterus.       Right eye: No discharge.        Left eye: No discharge.     Conjunctiva/sclera: Conjunctivae normal.  Cardiovascular:     Rate and Rhythm: Normal rate and regular rhythm.     Heart sounds: Normal heart sounds. No  murmur heard. No friction rub. No  gallop.   Pulmonary:     Effort: Pulmonary effort is normal. No respiratory distress.     Breath sounds: No stridor. Wheezing present. No rhonchi or rales.  Musculoskeletal:        General: Normal range of motion.     Cervical back: Normal range of motion.  Skin:    General: Skin is warm and dry.     Capillary Refill: Capillary refill takes less than 2 seconds.  Neurological:     General: No focal deficit present.     Mental Status: She is alert and oriented to person, place, and time. Mental status is at baseline.  Psychiatric:        Mood and Affect: Mood normal.        Behavior: Behavior normal.        Thought Content: Thought content normal.        Judgment: Judgment normal.    Diabetic Foot Exam - Simple   Simple Foot Form Diabetic Foot exam was performed with the following findings: Yes 02/19/2021  3:09 PM  Visual Inspection See comments: Yes Sensation Testing Intact to touch and monofilament testing bilaterally: Yes Pulse Check Posterior Tibialis and Dorsalis pulse intact bilaterally: Yes Comments Long thick toenails. Amputated right great toe. Growth under 2nd toe.      Lab Results  Component Value Date   TSH 2.030 10/21/2019   Lab Results  Component Value Date   WBC 7.9 11/16/2020   HGB 16.0 (H) 11/16/2020   HCT 47.7 (H) 11/16/2020   MCV 88 11/16/2020   PLT 217 11/16/2020   Lab Results  Component Value Date   NA 138 11/16/2020   K 4.1 11/16/2020   CO2 20 11/16/2020   GLUCOSE 196 (H) 11/16/2020   BUN 7 (L) 11/16/2020   CREATININE 1.17 (H) 11/16/2020   BILITOT 0.2 11/16/2020   ALKPHOS 118 11/16/2020   AST 15 11/16/2020   ALT 10 11/16/2020   PROT 6.9 11/16/2020   ALBUMIN 4.2 11/16/2020   CALCIUM 8.9 11/16/2020   ANIONGAP 9 02/19/2020   Lab Results  Component Value Date   CHOL 236 (H) 11/16/2020   Lab Results  Component Value Date   HDL 28 (L) 11/16/2020   Lab Results  Component Value Date   LDLCALC 159 (H) 11/16/2020   Lab Results   Component Value Date   TRIG 262 (H) 11/16/2020   Lab Results  Component Value Date   CHOLHDL 8.4 (H) 11/16/2020   Lab Results  Component Value Date   HGBA1C 9.2 (H) 11/16/2020

## 2021-02-20 LAB — LIPID PANEL
Chol/HDL Ratio: 5.9 ratio — ABNORMAL HIGH (ref 0.0–4.4)
Cholesterol, Total: 171 mg/dL (ref 100–199)
HDL: 29 mg/dL — ABNORMAL LOW (ref >39)
LDL Chol Calc (NIH): 98 mg/dL (ref 0–99)
Triglycerides: 258 mg/dL — ABNORMAL HIGH (ref 0–149)
VLDL Cholesterol Cal: 44 mg/dL — ABNORMAL HIGH (ref 5–40)

## 2021-02-20 LAB — CMP14+EGFR
ALT: 11 IU/L (ref 0–32)
AST: 13 IU/L (ref 0–40)
Albumin/Globulin Ratio: 1.7 (ref 1.2–2.2)
Albumin: 3.8 g/dL (ref 3.8–4.9)
Alkaline Phosphatase: 94 IU/L (ref 44–121)
BUN/Creatinine Ratio: 5 — ABNORMAL LOW (ref 12–28)
BUN: 5 mg/dL — ABNORMAL LOW (ref 8–27)
Bilirubin Total: 0.2 mg/dL (ref 0.0–1.2)
CO2: 20 mmol/L (ref 20–29)
Calcium: 8.5 mg/dL — ABNORMAL LOW (ref 8.7–10.3)
Chloride: 102 mmol/L (ref 96–106)
Creatinine, Ser: 1.05 mg/dL — ABNORMAL HIGH (ref 0.57–1.00)
Globulin, Total: 2.3 g/dL (ref 1.5–4.5)
Glucose: 198 mg/dL — ABNORMAL HIGH (ref 65–99)
Potassium: 3.7 mmol/L (ref 3.5–5.2)
Sodium: 137 mmol/L (ref 134–144)
Total Protein: 6.1 g/dL (ref 6.0–8.5)
eGFR: 61 mL/min/{1.73_m2} (ref >59)

## 2021-02-20 LAB — CBC WITH DIFFERENTIAL/PLATELET
Basophils Absolute: 0.1 10*3/uL (ref 0.0–0.2)
Basos: 1 %
EOS (ABSOLUTE): 0.1 10*3/uL (ref 0.0–0.4)
Eos: 2 %
Hematocrit: 43 % (ref 34.0–46.6)
Hemoglobin: 14.7 g/dL (ref 11.1–15.9)
Immature Grans (Abs): 0 10*3/uL (ref 0.0–0.1)
Immature Granulocytes: 0 %
Lymphocytes Absolute: 2.3 10*3/uL (ref 0.7–3.1)
Lymphs: 34 %
MCH: 31.2 pg (ref 26.6–33.0)
MCHC: 34.2 g/dL (ref 31.5–35.7)
MCV: 91 fL (ref 79–97)
Monocytes Absolute: 0.4 10*3/uL (ref 0.1–0.9)
Monocytes: 5 %
Neutrophils Absolute: 3.9 10*3/uL (ref 1.4–7.0)
Neutrophils: 58 %
Platelets: 186 10*3/uL (ref 150–450)
RBC: 4.71 x10E6/uL (ref 3.77–5.28)
RDW: 12.4 % (ref 11.7–15.4)
WBC: 6.7 10*3/uL (ref 3.4–10.8)

## 2021-04-04 ENCOUNTER — Ambulatory Visit (INDEPENDENT_AMBULATORY_CARE_PROVIDER_SITE_OTHER): Payer: Medicare Other | Admitting: Physician Assistant

## 2021-04-04 ENCOUNTER — Encounter: Payer: Self-pay | Admitting: Physician Assistant

## 2021-04-04 ENCOUNTER — Other Ambulatory Visit: Payer: Self-pay

## 2021-04-04 VITALS — BP 76/47 | HR 92 | Temp 97.0°F | Resp 20 | Ht 65.0 in | Wt 131.0 lb

## 2021-04-04 DIAGNOSIS — T07XXXA Unspecified multiple injuries, initial encounter: Secondary | ICD-10-CM

## 2021-04-04 DIAGNOSIS — E162 Hypoglycemia, unspecified: Secondary | ICD-10-CM

## 2021-04-04 LAB — GLUCOSE HEMOCUE WAIVED: Glu Hemocue Waived: 79 mg/dL (ref 65–99)

## 2021-04-04 NOTE — Progress Notes (Signed)
  Subjective:     Patient ID: Felicia Figueroa, female   DOB: Nov 22, 1960, 60 y.o.   MRN: [id number]  HPI Pt seen today due to feeling fatigued and having several falls over the last several days "Thinks her blood sugar is too low" Despite this pt unable to inform what her BS readings have been at home Questioned what she has eaten today and pt states nothing because she has not had the time   Review of Systems  Constitutional:  Positive for activity change and fatigue. Negative for appetite change and fever.  Respiratory: Negative.    Cardiovascular: Negative.   Genitourinary: Negative.       Objective:   Physical Exam Vitals and nursing note reviewed.  Constitutional:      General: She is not in acute distress.    Appearance: Normal appearance. She is not ill-appearing or toxic-appearing.     Comments: Using wheeled walker for ambulation  Neurological:     Mental Status: She is alert.  FROM of all ext + abrasions to the R elbow, R knee, and L knee Good strength to all ext Sensory intact Area cleansed and dressed in the office today BS -79 Pt given peanut butter crackers and water and observed for 1 hour After eating crackers and a honey bun and drinking water pt felt improved     Assessment:     1. Hypoglycemia   2. Abrasions of multiple sites        Plan:     Recently Glucotrol XL was increased so will decrease this back to 5 mg F/U with regular provider next week Stressed good diet with regular meals Would like for her to get in practice f checking BS Hydrate well

## 2021-04-04 NOTE — Patient Instructions (Signed)
Hypoglycemia Hypoglycemia is when the sugar (glucose) level in your blood is too low. Low blood sugar can happen to people who have diabetes and people who do not have diabetes. Low blood sugar can happenquickly, and it can be an emergency. What are the causes? This condition happens most often in people who have diabetes. It may be caused by: Diabetes medicine. Not eating enough, or not eating often enough. Doing more physical activity. Drinking alcohol on an empty stomach. If you do not have diabetes, this condition may be caused by: A tumor in the pancreas. Not eating enough, or not eating for long periods at a time (fasting). A very bad infection or illness. Problems after having weight loss (bariatric) surgery. Kidney failure or liver failure. Certain medicines. What increases the risk? This condition is more likely to develop in people who: Have diabetes and take medicines to lower their blood sugar. Abuse alcohol. Have a very bad illness. What are the signs or symptoms? Mild Hunger. Sweating and feeling clammy. Feeling dizzy or light-headed. Being sleepy or having trouble sleeping. Feeling like you may vomit (nauseous). A fast heartbeat. A headache. Blurry vision. Mood changes, such as: Being grouchy. Feeling worried or nervous (anxious). Tingling or loss of feeling (numbness) around your mouth, lips, or tongue. Moderate Confusion and poor judgment. Behavior changes. Weakness. Uneven heartbeat. Trouble with moving (coordination). Very low Very low blood sugar (severe hypoglycemia) is a medical emergency. It can cause: Fainting. Seizures. Loss of consciousness (coma). Death. How is this treated? Treating low blood sugar Low blood sugar is often treated by eating or drinking something that has sugar in it right away. The food or drink should contain 15 grams of a fast-acting carb (carbohydrate). Options include: 4 oz (120 mL) of fruit juice. 4 oz (120 mL) of  regular soda (not diet soda). A few pieces of hard candy. Check food labels to see how many pieces to eat for 15 grams. 1 Tbsp (15 mL) of sugar or honey. 4 glucose tablets. 1 tube of glucose gel. Treating low blood sugar if you have diabetes If you can think clearly and swallow safely, follow the 15:15 rule: Take 15 grams of a fast-acting carb. Talk with your doctor about how much you should take. Always keep a source of fast-acting carb with you, such as: Glucose tablets (take 4 tablets). A few pieces of hard candy. Check food labels to see how many pieces to eat for 15 grams. 4 oz (120 mL) of fruit juice. 4 oz (120 mL) of regular soda (not diet soda). 1 Tbsp (15 mL) of honey or sugar. 1 tube of glucose gel. Check your blood sugar 15 minutes after you take the carb. If your blood sugar is still at or below 70 mg/dL (3.9 mmol/L), take 15 grams of a carb again. If your blood sugar does not go above 70 mg/dL (3.9 mmol/L) after 3 tries, get help right away. After your blood sugar goes back to normal, eat a meal or a snack within 1 hour.  Treating very low blood sugar If your blood sugar is below 54 mg/dL (3 mmol/L), you have very low blood sugar, or severe hypoglycemia. This is an emergency. Get medical help right away. If you have very low blood sugar and you cannot eat or drink, you will need to be given a hormone called glucagon. A family member or friend should learn how to check your blood sugar and how to give you glucagon. Ask your doctor if youneed  to have an emergency glucagon kit at home. Very low blood sugar may also need to be treated in a hospital. Follow these instructions at home: General instructions Take over-the-counter and prescription medicines only as told by your doctor. Stay aware of your blood sugar as told by your doctor. If you drink alcohol: Limit how much you have to: 0-1 drink a day for women who are not pregnant. 0-2 drinks a day for men. Know how much  alcohol is in your drink. In the [place] one drink equals one 12 oz bottle of beer (355 mL), one 5 oz glass of wine (148 mL), or one 1 oz glass of hard liquor (44 mL). Be sure to eat food when you drink alcohol. Know that your body absorbs alcohol quickly. This may lead to low blood sugar later. Be sure to keep checking your blood sugar. Keep all follow-up visits. If you have diabetes:  Always have a fast-acting carb (15 grams) with you to treat low blood sugar. Follow your diabetes care plan as told by your doctor. Make sure you: Know the symptoms of low blood sugar. Check your blood sugar as often as told. Always check it before and after exercise. Always check your blood sugar before you drive. Take your medicines as told. Follow your meal plan. Eat on time. Do not skip meals. Share your diabetes care plan with: Your work or school. People you live with. Carry a card or wear jewelry that says you have diabetes.  Where to find more information American Diabetes Association: www.diabetes.org Contact a doctor if: You have trouble keeping your blood sugar in your target range. You have low blood sugar often. Get help right away if: You still have symptoms after you eat or drink something that contains 15 grams of fast-acting carb, and you cannot get your blood sugar above 70 mg/dL by following the 15:15 rule. Your blood sugar is below 54 mg/dL (3 mmol/L). You have a seizure. You faint. These symptoms may be an emergency. Get help right away. Call your local emergency services (911 in the [place] Do not wait to see if the symptoms will go away. Do not drive yourself to the hospital. Summary Hypoglycemia happens when the level of sugar (glucose) in your blood is too low. Low blood sugar can happen to people who have diabetes and people who do not have diabetes. Low blood sugar can happen quickly, and it can be an emergency. Make sure you know the symptoms of low blood sugar and know how  to treat it. Always keep a source of sugar (fast-acting carb) with you to treat low blood sugar. This information is not intended to replace advice given to you by your health care provider. Make sure you discuss any questions you have with your healthcare provider. Document Revised: 08/30/2020 Document Reviewed: 08/30/2020 Elsevier Patient Education  2022 [employer].

## 2021-04-06 ENCOUNTER — Other Ambulatory Visit: Payer: Self-pay | Admitting: Family Medicine

## 2021-04-06 DIAGNOSIS — Z1231 Encounter for screening mammogram for malignant neoplasm of breast: Secondary | ICD-10-CM

## 2021-04-08 ENCOUNTER — Encounter: Payer: Medicare Other | Admitting: Family Medicine

## 2021-04-17 ENCOUNTER — Encounter: Payer: Self-pay | Admitting: Family Medicine

## 2021-04-17 ENCOUNTER — Encounter (HOSPITAL_COMMUNITY): Payer: Self-pay

## 2021-04-17 ENCOUNTER — Ambulatory Visit (INDEPENDENT_AMBULATORY_CARE_PROVIDER_SITE_OTHER): Payer: Medicare Other | Admitting: Family Medicine

## 2021-04-17 ENCOUNTER — Other Ambulatory Visit: Payer: Self-pay

## 2021-04-17 ENCOUNTER — Emergency Department (HOSPITAL_COMMUNITY): Payer: Medicare Other

## 2021-04-17 ENCOUNTER — Inpatient Hospital Stay (HOSPITAL_COMMUNITY)
Admission: EM | Admit: 2021-04-17 | Discharge: 2021-04-19 | DRG: 683 | Disposition: A | Discharge status: Home Health | Payer: Medicare Other | Source: Ambulatory Visit | Attending: Family Medicine | Admitting: Family Medicine

## 2021-04-17 VITALS — BP 71/47 | HR 86 | Temp 97.5°F

## 2021-04-17 DIAGNOSIS — K219 Gastro-esophageal reflux disease without esophagitis: Secondary | ICD-10-CM | POA: Diagnosis present

## 2021-04-17 DIAGNOSIS — I952 Hypotension due to drugs: Secondary | ICD-10-CM | POA: Diagnosis present

## 2021-04-17 DIAGNOSIS — Z89411 Acquired absence of right great toe: Secondary | ICD-10-CM

## 2021-04-17 DIAGNOSIS — R300 Dysuria: Secondary | ICD-10-CM

## 2021-04-17 DIAGNOSIS — Z79899 Other long term (current) drug therapy: Secondary | ICD-10-CM

## 2021-04-17 DIAGNOSIS — E1142 Type 2 diabetes mellitus with diabetic polyneuropathy: Secondary | ICD-10-CM | POA: Diagnosis present

## 2021-04-17 DIAGNOSIS — I252 Old myocardial infarction: Secondary | ICD-10-CM

## 2021-04-17 DIAGNOSIS — E871 Hypo-osmolality and hyponatremia: Secondary | ICD-10-CM | POA: Diagnosis present

## 2021-04-17 DIAGNOSIS — Z833 Family history of diabetes mellitus: Secondary | ICD-10-CM

## 2021-04-17 DIAGNOSIS — R339 Retention of urine, unspecified: Secondary | ICD-10-CM

## 2021-04-17 DIAGNOSIS — I739 Peripheral vascular disease, unspecified: Secondary | ICD-10-CM | POA: Diagnosis present

## 2021-04-17 DIAGNOSIS — Z720 Tobacco use: Secondary | ICD-10-CM | POA: Diagnosis present

## 2021-04-17 DIAGNOSIS — N179 Acute kidney failure, unspecified: Principal | ICD-10-CM | POA: Diagnosis present

## 2021-04-17 DIAGNOSIS — Z7901 Long term (current) use of anticoagulants: Secondary | ICD-10-CM

## 2021-04-17 DIAGNOSIS — E162 Hypoglycemia, unspecified: Secondary | ICD-10-CM | POA: Diagnosis present

## 2021-04-17 DIAGNOSIS — E1169 Type 2 diabetes mellitus with other specified complication: Secondary | ICD-10-CM | POA: Diagnosis present

## 2021-04-17 DIAGNOSIS — I10 Essential (primary) hypertension: Secondary | ICD-10-CM | POA: Diagnosis present

## 2021-04-17 DIAGNOSIS — F411 Generalized anxiety disorder: Secondary | ICD-10-CM | POA: Diagnosis present

## 2021-04-17 DIAGNOSIS — F17210 Nicotine dependence, cigarettes, uncomplicated: Secondary | ICD-10-CM | POA: Diagnosis present

## 2021-04-17 DIAGNOSIS — E876 Hypokalemia: Secondary | ICD-10-CM | POA: Diagnosis present

## 2021-04-17 DIAGNOSIS — I959 Hypotension, unspecified: Secondary | ICD-10-CM | POA: Diagnosis not present

## 2021-04-17 DIAGNOSIS — F32A Depression, unspecified: Secondary | ICD-10-CM | POA: Diagnosis present

## 2021-04-17 DIAGNOSIS — Z888 Allergy status to other drugs, medicaments and biological substances status: Secondary | ICD-10-CM

## 2021-04-17 DIAGNOSIS — E785 Hyperlipidemia, unspecified: Secondary | ICD-10-CM | POA: Diagnosis present

## 2021-04-17 DIAGNOSIS — Z818 Family history of other mental and behavioral disorders: Secondary | ICD-10-CM

## 2021-04-17 DIAGNOSIS — E860 Dehydration: Secondary | ICD-10-CM | POA: Diagnosis present

## 2021-04-17 DIAGNOSIS — Z7984 Long term (current) use of oral hypoglycemic drugs: Secondary | ICD-10-CM

## 2021-04-17 DIAGNOSIS — E1149 Type 2 diabetes mellitus with other diabetic neurological complication: Secondary | ICD-10-CM | POA: Diagnosis present

## 2021-04-17 DIAGNOSIS — Z881 Allergy status to other antibiotic agents status: Secondary | ICD-10-CM

## 2021-04-17 DIAGNOSIS — E1151 Type 2 diabetes mellitus with diabetic peripheral angiopathy without gangrene: Secondary | ICD-10-CM | POA: Diagnosis present

## 2021-04-17 DIAGNOSIS — E1122 Type 2 diabetes mellitus with diabetic chronic kidney disease: Secondary | ICD-10-CM | POA: Diagnosis present

## 2021-04-17 DIAGNOSIS — Z885 Allergy status to narcotic agent status: Secondary | ICD-10-CM

## 2021-04-17 DIAGNOSIS — T502X5A Adverse effect of carbonic-anhydrase inhibitors, benzothiadiazides and other diuretics, initial encounter: Secondary | ICD-10-CM | POA: Diagnosis present

## 2021-04-17 DIAGNOSIS — Z8249 Family history of ischemic heart disease and other diseases of the circulatory system: Secondary | ICD-10-CM

## 2021-04-17 DIAGNOSIS — Z882 Allergy status to sulfonamides status: Secondary | ICD-10-CM

## 2021-04-17 DIAGNOSIS — N1832 Chronic kidney disease, stage 3b: Secondary | ICD-10-CM | POA: Diagnosis present

## 2021-04-17 DIAGNOSIS — Z825 Family history of asthma and other chronic lower respiratory diseases: Secondary | ICD-10-CM

## 2021-04-17 DIAGNOSIS — E11649 Type 2 diabetes mellitus with hypoglycemia without coma: Secondary | ICD-10-CM | POA: Diagnosis present

## 2021-04-17 DIAGNOSIS — Z955 Presence of coronary angioplasty implant and graft: Secondary | ICD-10-CM

## 2021-04-17 DIAGNOSIS — I129 Hypertensive chronic kidney disease with stage 1 through stage 4 chronic kidney disease, or unspecified chronic kidney disease: Secondary | ICD-10-CM | POA: Diagnosis present

## 2021-04-17 DIAGNOSIS — J449 Chronic obstructive pulmonary disease, unspecified: Secondary | ICD-10-CM | POA: Diagnosis present

## 2021-04-17 DIAGNOSIS — I2510 Atherosclerotic heart disease of native coronary artery without angina pectoris: Secondary | ICD-10-CM | POA: Diagnosis present

## 2021-04-17 DIAGNOSIS — D649 Anemia, unspecified: Secondary | ICD-10-CM | POA: Diagnosis present

## 2021-04-17 DIAGNOSIS — Z7982 Long term (current) use of aspirin: Secondary | ICD-10-CM

## 2021-04-17 DIAGNOSIS — Z20822 Contact with and (suspected) exposure to covid-19: Secondary | ICD-10-CM | POA: Diagnosis present

## 2021-04-17 LAB — COMPREHENSIVE METABOLIC PANEL
ALT: 11 U/L (ref 0–44)
AST: 18 U/L (ref 15–41)
Albumin: 3.3 g/dL — ABNORMAL LOW (ref 3.5–5.0)
Alkaline Phosphatase: 73 U/L (ref 38–126)
Anion gap: 9 (ref 5–15)
BUN: 28 mg/dL — ABNORMAL HIGH (ref 6–20)
CO2: 21 mmol/L — ABNORMAL LOW (ref 22–32)
Calcium: 8 mg/dL — ABNORMAL LOW (ref 8.9–10.3)
Chloride: 90 mmol/L — ABNORMAL LOW (ref 98–111)
Creatinine, Ser: 2.1 mg/dL — ABNORMAL HIGH (ref 0.44–1.00)
GFR, Estimated: 26 mL/min — ABNORMAL LOW (ref >60)
Glucose, Bld: 106 mg/dL — ABNORMAL HIGH (ref 70–99)
Potassium: 3 mmol/L — ABNORMAL LOW (ref 3.5–5.1)
Sodium: 120 mmol/L — ABNORMAL LOW (ref 135–145)
Total Bilirubin: 0.4 mg/dL (ref 0.3–1.2)
Total Protein: 5.8 g/dL — ABNORMAL LOW (ref 6.5–8.1)

## 2021-04-17 LAB — CBC WITH DIFFERENTIAL/PLATELET
Abs Immature Granulocytes: 0.04 10*3/uL (ref 0.00–0.07)
Basophils Absolute: 0 10*3/uL (ref 0.0–0.1)
Basophils Relative: 0 %
Eosinophils Absolute: 0.1 10*3/uL (ref 0.0–0.5)
Eosinophils Relative: 1 %
HCT: 31.1 % — ABNORMAL LOW (ref 36.0–46.0)
Hemoglobin: 10.7 g/dL — ABNORMAL LOW (ref 12.0–15.0)
Immature Granulocytes: 1 %
Lymphocytes Relative: 25 %
Lymphs Abs: 1.6 10*3/uL (ref 0.7–4.0)
MCH: 30.7 pg (ref 26.0–34.0)
MCHC: 34.4 g/dL (ref 30.0–36.0)
MCV: 89.1 fL (ref 80.0–100.0)
Monocytes Absolute: 0.3 10*3/uL (ref 0.1–1.0)
Monocytes Relative: 5 %
Neutro Abs: 4.4 10*3/uL (ref 1.7–7.7)
Neutrophils Relative %: 68 %
Platelets: 221 10*3/uL (ref 150–400)
RBC: 3.49 MIL/uL — ABNORMAL LOW (ref 3.87–5.11)
RDW: 11.9 % (ref 11.5–15.5)
WBC: 6.4 10*3/uL (ref 4.0–10.5)
nRBC: 0 % (ref 0.0–0.2)

## 2021-04-17 LAB — PHOSPHORUS: Phosphorus: 3.6 mg/dL (ref 2.5–4.6)

## 2021-04-17 LAB — CBG MONITORING, ED
Glucose-Capillary: 158 mg/dL — ABNORMAL HIGH (ref 70–99)
Glucose-Capillary: 32 mg/dL — CL (ref 70–99)
Glucose-Capillary: 56 mg/dL — ABNORMAL LOW (ref 70–99)
Glucose-Capillary: 115 mg/dL — ABNORMAL HIGH (ref 70–99)
Glucose-Capillary: 56 mg/dL — ABNORMAL LOW (ref 70–99)

## 2021-04-17 LAB — RESP PANEL BY RT-PCR (FLU A&B, COVID) ARPGX2
Influenza A by PCR: NEGATIVE
Influenza B by PCR: NEGATIVE
SARS Coronavirus 2 by RT PCR: NEGATIVE

## 2021-04-17 LAB — CK: Total CK: 71 U/L (ref 38–234)

## 2021-04-17 LAB — MAGNESIUM: Magnesium: 1.6 mg/dL — ABNORMAL LOW (ref 1.7–2.4)

## 2021-04-17 LAB — TROPONIN I (HIGH SENSITIVITY): Troponin I (High Sensitivity): 6 ng/L (ref <18)

## 2021-04-17 LAB — GLUCOSE HEMOCUE WAIVED: Glu Hemocue Waived: 54 mg/dL — ABNORMAL LOW (ref 65–99)

## 2021-04-17 LAB — LACTIC ACID, PLASMA: Lactic Acid, Venous: 1.8 mmol/L (ref 0.5–1.9)

## 2021-04-17 MED ORDER — HYDROXYZINE HCL 25 MG PO TABS: 25.0000 mg | ORAL_TABLET | Freq: Three times a day (TID) | ORAL | Status: DC | PRN

## 2021-04-17 MED ORDER — ONDANSETRON HCL 4 MG PO TABS: 4.0000 mg | ORAL_TABLET | Freq: Four times a day (QID) | ORAL | Status: DC | PRN

## 2021-04-17 MED ORDER — GABAPENTIN 300 MG PO CAPS
600.0000 mg | ORAL_CAPSULE | Freq: Two times a day (BID) | ORAL | Status: DC
  Administered 2021-04-17 – 2021-04-19 (×4): 600 mg via ORAL
  Filled 2021-04-17 (×4): qty 2

## 2021-04-17 MED ORDER — SODIUM CHLORIDE 0.9 % IV BOLUS
1000.0000 mL | Freq: Once | INTRAVENOUS | Status: AC
  Administered 2021-04-17: 1000 mL via INTRAVENOUS

## 2021-04-17 MED ORDER — ALBUTEROL SULFATE HFA 108 (90 BASE) MCG/ACT IN AERS: 2.0000 | INHALATION_SPRAY | Freq: Four times a day (QID) | RESPIRATORY_TRACT | Status: DC | PRN

## 2021-04-17 MED ORDER — DEXTROSE 50 % IV SOLN: 25.0000 g | INTRAVENOUS | Status: DC | PRN

## 2021-04-17 MED ORDER — UMECLIDINIUM-VILANTEROL 62.5-25 MCG/INH IN AEPB
1.0000 | INHALATION_SPRAY | Freq: Every day | RESPIRATORY_TRACT | Status: DC
  Filled 2021-04-17: qty 14

## 2021-04-17 MED ORDER — EZETIMIBE 10 MG PO TABS
10.0000 mg | ORAL_TABLET | Freq: Every day | ORAL | Status: DC
  Administered 2021-04-18 – 2021-04-19 (×2): 10 mg via ORAL
  Filled 2021-04-17 (×2): qty 1

## 2021-04-17 MED ORDER — ONDANSETRON HCL 4 MG/2ML IJ SOLN: 4.0000 mg | Freq: Four times a day (QID) | INTRAMUSCULAR | Status: DC | PRN

## 2021-04-17 MED ORDER — ACETAMINOPHEN 650 MG RE SUPP: 650.0000 mg | Freq: Four times a day (QID) | RECTAL | Status: DC | PRN

## 2021-04-17 MED ORDER — ESOMEPRAZOLE MAGNESIUM 20 MG PO PACK: 20.0000 mg | PACK | Freq: Every day | ORAL | Status: DC

## 2021-04-17 MED ORDER — ACETAMINOPHEN 325 MG PO TABS: 650.0000 mg | ORAL_TABLET | Freq: Four times a day (QID) | ORAL | Status: DC | PRN

## 2021-04-17 MED ORDER — ASPIRIN EC 81 MG PO TBEC
81.0000 mg | DELAYED_RELEASE_TABLET | Freq: Every day | ORAL | Status: DC
  Administered 2021-04-18 – 2021-04-19 (×2): 81 mg via ORAL
  Filled 2021-04-17 (×3): qty 1

## 2021-04-17 MED ORDER — KCL IN DEXTROSE-NACL 20-5-0.9 MEQ/L-%-% IV SOLN
INTRAVENOUS | Status: DC
  Filled 2021-04-17 (×8): qty 1000

## 2021-04-17 MED ORDER — DEXTROSE 50 % IV SOLN
INTRAVENOUS | Status: AC
  Administered 2021-04-17: 50 mL
  Filled 2021-04-17: qty 50

## 2021-04-17 MED ORDER — PANTOPRAZOLE SODIUM 40 MG PO TBEC
40.0000 mg | DELAYED_RELEASE_TABLET | Freq: Every day | ORAL | Status: DC
  Administered 2021-04-18 – 2021-04-19 (×2): 40 mg via ORAL
  Filled 2021-04-17 (×2): qty 1

## 2021-04-17 MED ORDER — ESCITALOPRAM OXALATE 10 MG PO TABS
20.0000 mg | ORAL_TABLET | Freq: Every day | ORAL | Status: DC
  Administered 2021-04-18 – 2021-04-19 (×2): 20 mg via ORAL
  Filled 2021-04-17 (×2): qty 2

## 2021-04-17 NOTE — ED Triage Notes (Addendum)
Pt bib ems for hypotension.  Reports weakness and dizziness x 2 week.  Reports multiple falls.  Hypotensive at pcp office today.  Bgl 72 by ems and 32 on arrival to ed.  500 ml bolus given by ems.  Normal ekg by ems.  Pt is alert and oriented.  Resp even and unlabored.  Skin warm and dry.

## 2021-04-17 NOTE — ED Provider Notes (Signed)
[facility] EMERGENCY DEPARTMENT Provider Note   CSN: [id number] Arrival date & time: 04/17/21  1625     History No chief complaint on file.   Felicia Figueroa is a 60 y.o. female.  HPI  This patient is a 60 year old female with a known history of coronary disease COPD diabetes and hypertension.  She does have some diabetic nephropathy.  The patient presents to the hospital from the doctor's office where she was seen today, she did not have anything to eat this morning because she thought her doctor might want to do some testing on her.  She states that over the last 2 weeks she has had some frequent lightheaded spells especially when she stands up to try to walk, she gets very dizzy and has even been walking with a walker.  She states that today she was having the same symptoms, she was found at the doctor's office to have hypotension with a blood pressure that was very low, the paramedics were called to bring her to the hospital and found her blood sugar to be 72, on arrival the sugar dropped to 32.  The patient was given some oral food and fluid and seemed to improve.  The patient denies fevers chills nausea vomiting or diarrhea, she states her appetite has been okay though she did not eat today in apprehension of going to the doctor's office.  She was recently started on new diabetic medications and a review of her medical history shows that she is currently taking a combination oral medication.  No recent travel, intermittent abdominal pain, none today but did have some yesterday.  No swelling in the legs, no new numbness or weakness or tingling, no changes in her vision other than feeling like she is seeing spots when she stands up.  She does take lisinopril hydrochlorothiazide.  Denies chest pain or shortness of breath, denies fevers or COVID exposures  Past Medical History:  Diagnosis Date   Anxiety    CAD (coronary artery disease)    COPD (chronic obstructive pulmonary disease)  (HCC)    Depression    Diabetes ([place])    Diabetic nephropathy ([place])    History of MI (myocardial infarction)    Hypertension    PVD (peripheral vascular disease) ([place])     Patient Active Problem List   Diagnosis Date Noted   AKI (acute kidney injury) ([place]) 04/17/2021   Diabetic nephropathy ([place])    Educated about COVID-19 virus infection 11/15/2019   Hypertension 10/21/2019   Amputation of right great toe ([place]) 10/21/2019   Diabetic polyneuropathy associated with type 2 diabetes mellitus ([place]) 12/22/2017   Type 2 diabetes mellitus with complication, with long-term current use of insulin ([place]) 12/22/2017   Hyperlipidemia due to type 2 diabetes mellitus ([place]) 02/03/2017   Lumbosacral spondylosis without myelopathy 05/08/2016   Generalized anxiety disorder 01/21/2016   Gastroesophageal reflux disease 12/11/2015   Chronic bilateral low back pain without sciatica 09/10/2015   Degenerative disc disease at L5-S1 level 09/10/2015   Simple chronic bronchitis ([place]) 09/10/2015   Moderate episode of recurrent major depressive disorder ([place]) 06/08/2015   Chronic pain syndrome 12/06/2014   CKD stage 3 due to type 2 diabetes mellitus ([place]) 12/06/2014   Heavy cigarette smoker (20-39 per day) 12/06/2014   Coronary artery disease involving native coronary artery of native heart without angina pectoris 12/05/2014   PVD (peripheral vascular disease) with claudication ([place]) 12/05/2014    Past Surgical History:  Procedure Laterality Date   AMPUTATION TOE  CARDIAC SURGERY     Stent    KIDNEY SURGERY     stents placed     OB History   No obstetric history on file.     Family History  Problem Relation Age of Onset   Depression Mother    Stroke Mother    COPD Father    Cirrhosis Father    Diabetes Sister    Anxiety disorder Sister    Tremor Sister    Congestive Heart Failure Sister        No details, sudden death   Obesity Daughter     Social History   Tobacco Use   Smoking  status: Every Day    Packs/day: 1.00    Years: 35.00    Pack years: 35.00    Types: Cigarettes   Smokeless tobacco: Never  Vaping Use   Vaping Use: Never used  Substance Use Topics   Alcohol use: Not Currently   Drug use: Never    Home Medications Prior to Admission medications   Medication Sig Start Date End Date Taking? Authorizing Provider  albuterol (VENTOLIN HFA) 108 (90 Base) MCG/ACT inhaler Inhale 2 puffs into the lungs every 6 (six) hours as needed for wheezing or shortness of breath. 03/23/20  Yes Hendricks Limes F, FNP  aspirin EC 81 MG tablet Take 1 tablet (81 mg total) by mouth daily. 11/16/19  Yes Minus Breeding, MD  atorvastatin (LIPITOR) 80 MG tablet Take 1 tablet (80 mg total) by mouth daily. 11/16/19  Yes Hochrein, Jeneen Rinks, MD  Empagliflozin-linaGLIPtin (GLYXAMBI) 25-5 MG TABS Take 1 tablet by mouth daily. 02/19/21  Yes Hendricks Limes F, FNP  escitalopram (LEXAPRO) 20 MG tablet Take 1 tablet (20 mg total) by mouth daily. 02/19/21  Yes Hendricks Limes F, FNP  esomeprazole (NEXIUM) 20 MG packet Take 20 mg by mouth daily before breakfast. 08/16/20  Yes Gwenlyn Perking, FNP  ezetimibe (ZETIA) 10 MG tablet Take 1 tablet (10 mg total) by mouth daily. 11/20/20  Yes Loman Brooklyn, FNP  gabapentin (NEURONTIN) 600 MG tablet Take 1 tablet (600 mg total) by mouth 2 (two) times daily. 02/19/21  Yes Loman Brooklyn, FNP  glipiZIDE (GLUCOTROL XL) 10 MG 24 hr tablet Take 1 tablet (10 mg total) by mouth daily with breakfast. 02/19/21  Yes Hendricks Limes F, FNP  hydrOXYzine (ATARAX/VISTARIL) 25 MG tablet TAKE 1 TABLET UP TO 3 TIMES A DAY FOR ANXIETY 02/19/21  Yes Hendricks Limes F, FNP  ibuprofen (ADVIL) 800 MG tablet TAKE 1 TABLET EVERY 8 HOURS AS NEEDED 02/21/21  Yes Hendricks Limes F, FNP  lisinopril-hydrochlorothiazide (ZESTORETIC) 10-12.5 MG tablet Take 1 tablet by mouth daily. 02/19/21  Yes Loman Brooklyn, FNP  Pediatric Multivit-Minerals-C (ONE-A-DAY SCOOBY-DOO GUMMIES PO) Take 1 tablet by  mouth daily.   Yes [provider]  umeclidinium-vilanterol (ANORO ELLIPTA) 62.5-25 MCG/INH AEPB Inhale 1 puff into the lungs daily. 11/16/20  Yes Hendricks Limes F, FNP  Blood Glucose Monitoring Suppl (ONETOUCH VERIO REFLECT) w/Device KIT Use test blood sugar daily in the morning 02/22/20   Loman Brooklyn, FNP  glucose blood (ONETOUCH VERIO) test strip Use to test blood sugar daily as directed. DX: E11.9 06/21/20   Loman Brooklyn, FNP  OneTouch Delica Lancets 58N MISC Use to test blood sugar daily as directed. DX: E11.9 06/21/20   Hendricks Limes F, FNP  traZODone (DESYREL) 50 MG tablet Take 50-100 mg by mouth at bedtime as needed. Patient not taking: No sig reported 11/19/20  [provider]    Allergies    Cephalexin, Hydrocodone-acetaminophen, Sulfa antibiotics, and Metformin and related  Review of Systems   Review of Systems  All other systems reviewed and are negative.  Physical Exam Updated Vital Signs BP 116/77   Pulse 86   Temp 97.8 F (36.6 C)   Resp 14   Wt 59.4 kg   SpO2 99%   BMI 21.79 kg/m   Physical Exam Vitals and nursing note reviewed.  Constitutional:      General: She is not in acute distress.    Appearance: She is well-developed.  HENT:     Head: Normocephalic and atraumatic.     Mouth/Throat:     Pharynx: No oropharyngeal exudate.  Eyes:     General: No scleral icterus.       Right eye: No discharge.        Left eye: No discharge.     Conjunctiva/sclera: Conjunctivae normal.     Pupils: Pupils are equal, round, and reactive to light.  Neck:     Thyroid: No thyromegaly.     Vascular: No JVD.  Cardiovascular:     Rate and Rhythm: Normal rate and regular rhythm.     Heart sounds: Normal heart sounds. No murmur heard.   No friction rub. No gallop.  Pulmonary:     Effort: Pulmonary effort is normal. No respiratory distress.     Breath sounds: Normal breath sounds. No wheezing or rales.  Abdominal:     General: Bowel sounds are normal.  There is no distension.     Palpations: Abdomen is soft. There is no mass.     Tenderness: There is no abdominal tenderness.  Musculoskeletal:        General: No tenderness. Normal range of motion.     Cervical back: Normal range of motion and neck supple.  Lymphadenopathy:     Cervical: No cervical adenopathy.  Skin:    General: Skin is warm and dry.     Findings: No erythema or rash.  Neurological:     Mental Status: She is alert.     Coordination: Coordination normal.     Comments: The patient is awake alert and able to move all 4 extremities, normal strength in all 4 extremities, normal coordination, normal facial symmetry, cranial nerves III through XII are normal.  Psychiatric:        Behavior: Behavior normal.    ED Results / Procedures / Treatments   Labs (all labs ordered are listed, but only abnormal results are displayed) Labs Reviewed  CBC WITH DIFFERENTIAL/PLATELET - Abnormal; Notable for the following components:      Result Value   RBC 3.49 (*)    Hemoglobin 10.7 (*)    HCT 31.1 (*)    All other components within normal limits  COMPREHENSIVE METABOLIC PANEL - Abnormal; Notable for the following components:   Sodium 120 (*)    Potassium 3.0 (*)    Chloride 90 (*)    CO2 21 (*)    Glucose, Bld 106 (*)    BUN 28 (*)    Creatinine, Ser 2.10 (*)    Calcium 8.0 (*)    Total Protein 5.8 (*)    Albumin 3.3 (*)    GFR, Estimated 26 (*)    All other components within normal limits  CBG MONITORING, ED - Abnormal; Notable for the following components:   Glucose-Capillary 32 (*)    All other components within normal limits  CBG MONITORING,  ED - Abnormal; Notable for the following components:   Glucose-Capillary 56 (*)    All other components within normal limits  CBG MONITORING, ED - Abnormal; Notable for the following components:   Glucose-Capillary 158 (*)    All other components within normal limits  URINE CULTURE  LACTIC ACID, PLASMA  URINALYSIS, ROUTINE W  REFLEX MICROSCOPIC  TROPONIN I (HIGH SENSITIVITY)    EKG None  Radiology DG Chest Port 1 View  Result Date: 04/17/2021 CLINICAL DATA:  Weakness.  Hypotensive.  Dizziness. EXAM: PORTABLE CHEST 1 VIEW COMPARISON:  Radiograph 02/19/2020 FINDINGS: Upper normal heart size. Normal unchanged mediastinal contours. There is mild interstitial thickening that is new from prior exam. No focal airspace disease. No pneumothorax. No large pleural effusion. No acute osseous abnormalities are seen. IMPRESSION: Mild interstitial thickening, new from prior exam, may be pulmonary edema or atypical infection. Electronically Signed   By: Keith Rake M.D.   On: 04/17/2021 17:59    Procedures .Critical Care  Date/Time: 04/17/2021 7:30 PM Performed by: Noemi Chapel, MD Authorized by: Noemi Chapel, MD   Critical care provider statement:    Critical care time (minutes):  35   Critical care time was exclusive of:  Separately billable procedures and treating other patients and teaching time   Critical care was necessary to treat or prevent imminent or life-threatening deterioration of the following conditions:  Endocrine crisis   Critical care was time spent personally by me on the following activities:  Blood draw for specimens, development of treatment plan with patient or surrogate, discussions with consultants, evaluation of patient's response to treatment, examination of patient, obtaining history from patient or surrogate, ordering and performing treatments and interventions, ordering and review of laboratory studies, ordering and review of radiographic studies, pulse oximetry, re-evaluation of patient's condition and review of old charts   Medications Ordered in ED Medications  dextrose 50 % solution (50 mLs  Given 04/17/21 1703)  sodium chloride 0.9 % bolus 1,000 mL (1,000 mLs Intravenous New Bag/Given 04/17/21 1710)    ED Course  I have reviewed the triage vital signs and the nursing notes.  Pertinent  labs & imaging results that were available during my care of the patient were reviewed by me and considered in my medical decision making (see chart for details).    MDM Rules/Calculators/A&P                          The patient's exam is actually quite unremarkable at this time, her blood sugar has improved significantly.  She is mildly hypotensive at 90 systolic.  I suspect that this is the reason for her lightheadedness over the last couple of weeks.  That being said we will need to check liver function, check for signs of infection especially a bladder infection given the intermittent lower abdominal pain.  IV fluids, the patient will need to eat and drink to make sure the blood sugar stays up.  She does not take any insulin.  She does not have a fever is not tachycardic or hypoxic and at this time is normal mental status.  This patient has multiple abnormalities of her laboratory work-up which is going to require admission, in fact she is severely hyponatremic with a level of 120, she has an acute kidney injury with a creatinine of 2.1, this is doubled compared to her baseline.  She has been given IV fluids, she was given D50, she has maintained a normal blood  sugar since that time.  She was also eating something on arrival.  Her hypotension has gradually improved with IV fluids and currently is 116/77.  This patient will need to be admitted, I discussed her care with Dr. Olevia Bowens, he has been kind enough to admit this patient to hospital  Final Clinical Impression(s) / ED Diagnoses Final diagnoses:  Acute kidney injury ([place])  Hyponatremia  Hypoglycemia    Rx / DC Orders ED Discharge Orders     None        Noemi Chapel, MD 04/17/21 1931

## 2021-04-17 NOTE — ED Notes (Signed)
Pt given 2 cups of orange juice for hypoglycemia.

## 2021-04-17 NOTE — H&P (Addendum)
History and Physical    Serrena Linderman [facility] ZYY:[id number] DOB: 19-Jan-1961 DOA: 04/17/2021  PCP: Loman Brooklyn, FNP  Patient coming from: Home/from PCPs office.  I have personally briefly reviewed patient's old medical records in [place]  Chief Complaint: Hypotension.  HPI: Felicia Figueroa is a 60 y.o. female with medical history significant of generalized anxiety disorder, CAD, history of MI, PVD, hypertension, hyperlipidemia, type 2 diabetes mellitus, diabetic peripheral neuropathy, diabetic nephropathy, COPD, active tobacco use, GERD who is coming to the emergency department after having hypotension at the doctor's office.  The patient was following and had not had anything to eat during the day thinking that she was going to get blood work.  She also endorsed dysuria and decreased urinary output.  She denies fever, flank pain, but complains of chills and fatigue.  She has been lightheaded.  She denied rhinorrhea, sore throat, dyspnea, wheezing, chest pain, diaphoresis, PND, orthopnea or pitting edema of the lower extremities.  Denied abdominal pain, nausea, emesis, diarrhea, constipation, melena or hematochezia.  No polyuria, polydipsia, polyphagia or blurred vision.  ED Course: Initial vital signs were temperature 97.8 F, pulse 81, respirations 20, BP 91/64 mmHg O2 sat 100% on room air.  The patient CBG was 32 mg/dL on arrival to the ED but has been able to eat since.  She received a 1000 mL NS bolus as well.  Lab work: CBC shows a white count of 6.4 with a normal differential, hemoglobin 10.7 g/dL (down from 14.7 g/dL on 02/19/2021) and platelets were 221.  Sodium was 120, potassium 3.0, chloride 90 and CO2 21 mmol/L with a normal anion gap.  Glucose is 106, BUN 28, creatinine 2.10 and calcium 8.0 mg/dL.  Total protein is 5.9 and albumin 3.3 g/dL.  The rest of the hepatic functions are within normal range.  Troponin and lactic acid level were normal.  Imaging: A portable 1  view chest radiograph shows mild interstitial thickening which is new from previous examination.  Consider pulmonary edema or atypical infection.  Please see image and full radiology report for further detail.  Review of Systems: As per HPI otherwise all other systems reviewed and are negative.  Past Medical History:  Diagnosis Date   Anxiety    CAD (coronary artery disease)    COPD (chronic obstructive pulmonary disease) ([place])    Depression    Diabetes ([place])    Diabetic nephropathy ([place])    History of MI (myocardial infarction)    Hypertension    PVD (peripheral vascular disease) ([place])    Past Surgical History:  Procedure Laterality Date   AMPUTATION TOE     CARDIAC SURGERY     Stent    KIDNEY SURGERY     stents placed   Social History  reports that she has been smoking cigarettes. She has a 35.00 pack-year smoking history. She has never used smokeless tobacco. She reports previous alcohol use. She reports that she does not use drugs.  Allergies  Allergen Reactions   Cephalexin Nausea And Vomiting   Hydrocodone-Acetaminophen Itching, Nausea And Vomiting and Rash   Sulfa Antibiotics Rash   Metformin And Related Diarrhea    Family History  Problem Relation Age of Onset   Depression Mother    Stroke Mother    COPD Father    Cirrhosis Father    Diabetes Sister    Anxiety disorder Sister    Tremor Sister    Congestive Heart Failure Sister  No details, sudden death   Obesity Daughter    Prior to Admission medications   Medication Sig Start Date End Date Taking? Authorizing Provider  albuterol (VENTOLIN HFA) 108 (90 Base) MCG/ACT inhaler Inhale 2 puffs into the lungs every 6 (six) hours as needed for wheezing or shortness of breath. 03/23/20  Yes Hendricks Limes F, FNP  aspirin EC 81 MG tablet Take 1 tablet (81 mg total) by mouth daily. 11/16/19  Yes Minus Breeding, MD  atorvastatin (LIPITOR) 80 MG tablet Take 1 tablet (80 mg total) by mouth daily. 11/16/19  Yes  Hochrein, Jeneen Rinks, MD  Empagliflozin-linaGLIPtin (GLYXAMBI) 25-5 MG TABS Take 1 tablet by mouth daily. 02/19/21  Yes Hendricks Limes F, FNP  escitalopram (LEXAPRO) 20 MG tablet Take 1 tablet (20 mg total) by mouth daily. 02/19/21  Yes Hendricks Limes F, FNP  esomeprazole (NEXIUM) 20 MG packet Take 20 mg by mouth daily before breakfast. 08/16/20  Yes Gwenlyn Perking, FNP  ezetimibe (ZETIA) 10 MG tablet Take 1 tablet (10 mg total) by mouth daily. 11/20/20  Yes Loman Brooklyn, FNP  gabapentin (NEURONTIN) 600 MG tablet Take 1 tablet (600 mg total) by mouth 2 (two) times daily. 02/19/21  Yes Loman Brooklyn, FNP  glipiZIDE (GLUCOTROL XL) 10 MG 24 hr tablet Take 1 tablet (10 mg total) by mouth daily with breakfast. 02/19/21  Yes Hendricks Limes F, FNP  hydrOXYzine (ATARAX/VISTARIL) 25 MG tablet TAKE 1 TABLET UP TO 3 TIMES A DAY FOR ANXIETY 02/19/21  Yes Hendricks Limes F, FNP  ibuprofen (ADVIL) 800 MG tablet TAKE 1 TABLET EVERY 8 HOURS AS NEEDED 02/21/21  Yes Hendricks Limes F, FNP  lisinopril-hydrochlorothiazide (ZESTORETIC) 10-12.5 MG tablet Take 1 tablet by mouth daily. 02/19/21  Yes Loman Brooklyn, FNP  Pediatric Multivit-Minerals-C (ONE-A-DAY SCOOBY-DOO GUMMIES PO) Take 1 tablet by mouth daily.   Yes [provider]  umeclidinium-vilanterol (ANORO ELLIPTA) 62.5-25 MCG/INH AEPB Inhale 1 puff into the lungs daily. 11/16/20  Yes Hendricks Limes F, FNP  Blood Glucose Monitoring Suppl (ONETOUCH VERIO REFLECT) w/Device KIT Use test blood sugar daily in the morning 02/22/20   Loman Brooklyn, FNP  glucose blood (ONETOUCH VERIO) test strip Use to test blood sugar daily as directed. DX: E11.9 06/21/20   Loman Brooklyn, FNP  OneTouch Delica Lancets 38B MISC Use to test blood sugar daily as directed. DX: E11.9 06/21/20   Hendricks Limes F, FNP  traZODone (DESYREL) 50 MG tablet Take 50-100 mg by mouth at bedtime as needed. Patient not taking: No sig reported 11/19/20   [provider]    Physical  Exam: Vitals:   04/17/21 1658 04/17/21 1730 04/17/21 1800 04/17/21 1838  BP:  99/61 98/65 116/77  Pulse:    86  Resp:  17 18 14   Temp: 97.8 F (36.6 C)     SpO2:   100% 99%  Weight:        Constitutional: Looks chronically ill.  NAD, calm, comfortable Eyes: PERRL, lids and conjunctivae pale. ENMT: Mucous membranes are mildly dry.  Posterior pharynx clear of any exudate or lesions.  Neck: normal, supple, no masses, no thyromegaly Respiratory: clear to auscultation bilaterally, no wheezing, no crackles. Normal respiratory effort. No accessory muscle use.  Cardiovascular: Regular rate and rhythm, no murmurs / rubs / gallops. No extremity edema. 2+ pedal pulses. No carotid bruits.  Abdomen: No distention.  Bowel sounds positive.  Soft, no tenderness, no masses palpated. No hepatosplenomegaly. Musculoskeletal: no clubbing / cyanosis. Good ROM, no  contractures. Normal muscle tone.  Right great toe amputation. Skin: The skin looks tanned.  No acute rashes or lesions seen or reported by the patient on very limited dermatological examination. Neurologic: CN 2-12 grossly intact. Sensation intact, DTR normal. Strength 5/5 in all 4.  Psychiatric: Normal judgment and insight. Alert and oriented x 3. Normal mood.   Labs on Admission: I have personally reviewed following labs and imaging studies  CBC: Recent Labs  Lab 04/17/21 1745  WBC 6.4  NEUTROABS 4.4  HGB 10.7*  HCT 31.1*  MCV 89.1  PLT 401    Basic Metabolic Panel: Recent Labs  Lab 04/17/21 1745  NA 120*  K 3.0*  CL 90*  CO2 21*  GLUCOSE 106*  BUN 28*  CREATININE 2.10*  CALCIUM 8.0*    GFR: Estimated Creatinine Clearance: 25.6 mL/min (A) (by C-G formula based on SCr of 2.1 mg/dL (H)).  Liver Function Tests: Recent Labs  Lab 04/17/21 1745  AST 18  ALT 11  ALKPHOS 73  BILITOT 0.4  PROT 5.8*  ALBUMIN 3.3*   Radiological Exams on Admission: DG Chest Port 1 View  Result Date: 04/17/2021 CLINICAL DATA:  Weakness.   Hypotensive.  Dizziness. EXAM: PORTABLE CHEST 1 VIEW COMPARISON:  Radiograph 02/19/2020 FINDINGS: Upper normal heart size. Normal unchanged mediastinal contours. There is mild interstitial thickening that is new from prior exam. No focal airspace disease. No pneumothorax. No large pleural effusion. No acute osseous abnormalities are seen. IMPRESSION: Mild interstitial thickening, new from prior exam, may be pulmonary edema or atypical infection. Electronically Signed   By: Keith Rake M.D.   On: 04/17/2021 17:59    EKG: Independently reviewed.   Assessment/Plan Active Problems:     AKI (acute kidney injury) ([place]) Superimposed on   CKD stage 3 due to type 2 diabetes mellitus ([place]) In the setting of   Hypotension Secondary to HCTZ induced   Hyponatremia Placing observation/stepdown. Continue normal saline infusion. Monitor intake and output. Avoid hypotension. Hold ACE inhibitor and diuretic. Follow-up renal function electrolytes.  Active problems:   Hypokalemia Replacing. Follow-up potassium level.    Hypoglycemia Has eaten very little today. The patient is tolerating oral intake. Encourage fluid intake. Continue dextrose 5%/normal saline infusion. Hold oral hypoglycemic medications. Monitor CBG closely.    Diabetic polyneuropathy associated with type 2 diabetes mellitus (HCC) Continue gabapentin 600 mg p.o. twice daily.    Gastroesophageal reflux disease Continue esomeprazole or formulary equivalent.    Generalized anxiety disorder Continue escitalopram 20 mg p.o. daily.    Hyperlipidemia due to type 2 diabetes mellitus (HCC) Hold atorvastatin. Check total CK in the setting of AKI. Resume atorvastatin if CK is normal.    Coronary artery disease  On aspirin and statin. No beta-blockers due to COPD. Follow-up with cardiology as scheduled.    PVD (peripheral vascular disease) with claudication (HCC) Continue aspirin and atorvastatin.    Hypertension Hold  antihypertensives. Monitor blood pressure.    Tobacco use Nicotine replacement therapy offered.    COPD (chronic obstructive pulmonary disease) (HCC) Continue Anoro Ellipta 1 inhalation daily. Supplemental oxygen and bronchodilators as needed.    DVT prophylaxis: SCDs. Code Status:   Full code. Family Communication:   Disposition Plan:   Patient is from:  Home.  Anticipated DC to:  Home.  Anticipated DC date:  04/18/2021.  Anticipated DC barriers: Clinical status.  Consults called:   Admission status:  Observation/telemetry.   Severity of Illness: High severity after presenting with hypotension, hypoglycemia with acute kidney  injury, hyponatremia and hypokalemia.  The patient will need to remain in the hospital for CBG monitoring, IV hydration and electrolyte replacement.  Reubin Milan MD [facility]  How to contact the [facility] Attending or Consulting provider [place] or covering provider during after hours [place], for this patient?   Check the care team in [facility] and look for a) attending/consulting [facility] provider listed and b) the [facility] team listed Log into [email or web address] and use [place]'s universal password to access. If you do not have the password, please contact the hospital operator. Locate the [facility] provider you are looking for under [facility] and page to a number that you can be directly reached. If you still have difficulty reaching the provider, please page the [facility] (Director on Call) for the Hospitalists listed on amion for assistance.  04/17/2021, 7:59 PM   This document was prepared using Dragon voice recognition software and may contain some unintended transcription errors.

## 2021-04-17 NOTE — Progress Notes (Signed)
Assessment & Plan:  1-4. Acute hypotension/Hypoglycemia/Dysuria/Unable to void Blood pressure 70/47 initially, 71/47 on recheck, 70/46 manually. Glucose level of 54. Explained to patient I do not think this is medication related, as she was tolerating her increased dose of glipizide XL 10 mg for 6 weeks before this started.  I suspect she has a UTI, but she is unable to leave a urine specimen.  I am transferring her to the ED via EMS for a sepsis work-up. - Glucose Hemocue Waived   Hendricks Limes, MSN, APRN, FNP-C [facility]  Subjective:    Patient ID: Felicia Figueroa, female    DOB: Aug 13, 1961, 60 y.o.   MRN: [id number]  Patient Care Team: Loman Brooklyn, FNP as PCP - General (Family Medicine) Minus Breeding, MD as PCP - Cardiology (Cardiology) Lavera Guise, [facility] (Pharmacist) Harlen Labs, MD as Referring Physician (Optometry)   Chief Complaint:  Chief Complaint  Patient presents with   Hypoglycemia    Patient was seen 6/23.  States she has not had a fall since she was seen but she was very unsteady and off balance.      HPI: Felicia Figueroa is a 60 y.o. female presenting on 04/17/2021 for Hypoglycemia (Patient was seen 6/23.  States she has not had a fall since she was seen but she was very unsteady and off balance.  )  Patient was seen on 04/04/2021 due to weakness and having multiple falls over the past few days.  Patient reported at that time that she felt like maybe her blood sugar was too low, but she was not checking her blood sugar at home.  The provider that saw her that day felt that since her glipizide XL was increased from 5 mg to 10 mg previously, she was having hypoglycemic episodes.  She was advised to decrease the glipizide XL back down to 5 mg and follow-up with me, her PCP.  Patient reports today that she has had 3 total falls.  Each time her legs start shaking, she gets weak, and her vision gets really bad prior to falling.   She denies any dizziness or onset of respiratory symptoms.  She does report dysuria and inability to urinate when she feels the urge.  She last urinated early this morning.  This has been going on since her falls first started.   Social history:  Relevant past medical, surgical, family and social history reviewed and updated as indicated. Interim medical history since our last visit reviewed.  Allergies and medications reviewed and updated.  DATA REVIEWED: CHART IN EPIC  ROS: Negative unless specifically indicated above in HPI.    Current Outpatient Medications:    albuterol (VENTOLIN HFA) 108 (90 Base) MCG/ACT inhaler, Inhale 2 puffs into the lungs every 6 (six) hours as needed for wheezing or shortness of breath., Disp: 18 g, Rfl: 2   aspirin EC 81 MG tablet, Take 1 tablet (81 mg total) by mouth daily., Disp: 90 tablet, Rfl: 3   atorvastatin (LIPITOR) 80 MG tablet, Take 1 tablet (80 mg total) by mouth daily., Disp: 90 tablet, Rfl: 3   Blood Glucose Monitoring Suppl (ONETOUCH VERIO REFLECT) w/Device KIT, Use test blood sugar daily in the morning, Disp: 1 kit, Rfl: 1   Empagliflozin-linaGLIPtin (GLYXAMBI) 25-5 MG TABS, Take 1 tablet by mouth daily., Disp: 90 tablet, Rfl: 1   escitalopram (LEXAPRO) 20 MG tablet, Take 1 tablet (20 mg total) by mouth daily., Disp: 90 tablet, Rfl: 1  esomeprazole (NEXIUM) 20 MG packet, Take 20 mg by mouth daily before breakfast., Disp: 30 each, Rfl: 12   ezetimibe (ZETIA) 10 MG tablet, Take 1 tablet (10 mg total) by mouth daily., Disp: 30 tablet, Rfl: 2   gabapentin (NEURONTIN) 600 MG tablet, Take 1 tablet (600 mg total) by mouth 2 (two) times daily., Disp: 180 tablet, Rfl: 1   glipiZIDE (GLUCOTROL XL) 10 MG 24 hr tablet, Take 1 tablet (10 mg total) by mouth daily with breakfast., Disp: 90 tablet, Rfl: 1   glucose blood (ONETOUCH VERIO) test strip, Use to test blood sugar daily as directed. DX: E11.9, Disp: 100 each, Rfl: 12   hydrOXYzine (ATARAX/VISTARIL) 25  MG tablet, TAKE 1 TABLET UP TO 3 TIMES A DAY FOR ANXIETY, Disp: 90 tablet, Rfl: 1   ibuprofen (ADVIL) 800 MG tablet, TAKE 1 TABLET EVERY 8 HOURS AS NEEDED, Disp: 90 tablet, Rfl: 1   lisinopril-hydrochlorothiazide (ZESTORETIC) 10-12.5 MG tablet, Take 1 tablet by mouth daily., Disp: 90 tablet, Rfl: 1   OneTouch Delica Lancets 96E MISC, Use to test blood sugar daily as directed. DX: E11.9, Disp: 100 each, Rfl: 3   umeclidinium-vilanterol (ANORO ELLIPTA) 62.5-25 MCG/INH AEPB, Inhale 1 puff into the lungs daily., Disp: 60 each, Rfl: 3   Allergies  Allergen Reactions   Cephalexin Nausea And Vomiting   Hydrocodone-Acetaminophen Itching, Nausea And Vomiting and Rash   Sulfa Antibiotics Rash   Metformin And Related Diarrhea   Past Medical History:  Diagnosis Date   Anxiety    CAD (coronary artery disease)    COPD (chronic obstructive pulmonary disease) (HCC)    Depression    Diabetes ([place])    Diabetic nephropathy (HCC)    History of MI (myocardial infarction)    Hypertension    PVD (peripheral vascular disease) ([place])     Past Surgical History:  Procedure Laterality Date   AMPUTATION TOE     CARDIAC SURGERY     Stent    KIDNEY SURGERY     stents placed    Social History   Socioeconomic History   Marital status: Divorced    Spouse name: Not on file   Number of children: 2   Years of education: 10   Highest education level: 10th grade  Occupational History   Occupation: Disabled  Tobacco Use   Smoking status: Every Day    Packs/day: 1.00    Years: 35.00    Pack years: 35.00    Types: Cigarettes   Smokeless tobacco: Never  Vaping Use   Vaping Use: Never used  Substance and Sexual Activity   Alcohol use: Not Currently   Drug use: Never   Sexual activity: Not Currently  Other Topics Concern   Not on file  Social History Narrative   Lives alone.   Two daughters.    Social Determinants of Health   Financial Resource Strain: Not on file  Food Insecurity: Not on file   Transportation Needs: Not on file  Physical Activity: Not on file  Stress: Not on file  Social Connections: Not on file  Intimate Partner Violence: Not on file        Objective:    BP (!) 71/47   Pulse 86   Temp (!) 97.5 F (36.4 C) (Temporal)   SpO2 96%   Wt Readings from Last 3 Encounters:  04/04/21 131 lb (59.4 kg)  02/19/21 133 lb 9.6 oz (60.6 kg)  11/27/20 135 lb (61.2 kg)    Physical Exam Vitals reviewed.  Constitutional:      General: She is not in acute distress.    Appearance: Normal appearance. She is not ill-appearing, toxic-appearing or diaphoretic.  HENT:     Head: Normocephalic and atraumatic.  Eyes:     General: No scleral icterus.       Right eye: No discharge.        Left eye: No discharge.     Conjunctiva/sclera: Conjunctivae normal.  Cardiovascular:     Rate and Rhythm: Normal rate and regular rhythm.     Heart sounds: Normal heart sounds. No murmur heard.   No friction rub. No gallop.  Pulmonary:     Effort: Pulmonary effort is normal. No respiratory distress.     Breath sounds: Normal breath sounds. No stridor. No wheezing, rhonchi or rales.  Musculoskeletal:        General: Normal range of motion.     Cervical back: Normal range of motion.  Skin:    General: Skin is warm and dry.     Capillary Refill: Capillary refill takes less than 2 seconds.  Neurological:     General: No focal deficit present.     Mental Status: She is alert and oriented to person, place, and time. Mental status is at baseline.     Motor: Weakness present.     Gait: Gait abnormal.     Comments: Patient was stumbling and walking on the sides of her feet when she came into the office.  She was having to hold onto the wall and was therefore placed in a wheelchair.  Psychiatric:        Mood and Affect: Mood normal.        Behavior: Behavior normal.        Thought Content: Thought content normal.        Judgment: Judgment normal.    Lab Results  Component Value Date    TSH 2.030 10/21/2019   Lab Results  Component Value Date   WBC 6.7 02/19/2021   HGB 14.7 02/19/2021   HCT 43.0 02/19/2021   MCV 91 02/19/2021   PLT 186 02/19/2021   Lab Results  Component Value Date   NA 137 02/19/2021   K 3.7 02/19/2021   CO2 20 02/19/2021   GLUCOSE 198 (H) 02/19/2021   BUN 5 (L) 02/19/2021   CREATININE 1.05 (H) 02/19/2021   BILITOT 0.2 02/19/2021   ALKPHOS 94 02/19/2021   AST 13 02/19/2021   ALT 11 02/19/2021   PROT 6.1 02/19/2021   ALBUMIN 3.8 02/19/2021   CALCIUM 8.5 (L) 02/19/2021   ANIONGAP 9 02/19/2020   EGFR 61 02/19/2021   Lab Results  Component Value Date   CHOL 171 02/19/2021   Lab Results  Component Value Date   HDL 29 (L) 02/19/2021   Lab Results  Component Value Date   LDLCALC 98 02/19/2021   Lab Results  Component Value Date   TRIG 258 (H) 02/19/2021   Lab Results  Component Value Date   CHOLHDL 5.9 (H) 02/19/2021   Lab Results  Component Value Date   HGBA1C 8.0 (H) 02/19/2021

## 2021-04-17 NOTE — ED Notes (Signed)
Pt up to bsc with 1 assist. Unable to provide urine sample

## 2021-04-18 ENCOUNTER — Encounter (HOSPITAL_COMMUNITY): Payer: Self-pay

## 2021-04-18 DIAGNOSIS — N179 Acute kidney failure, unspecified: Secondary | ICD-10-CM | POA: Diagnosis present

## 2021-04-18 DIAGNOSIS — E871 Hypo-osmolality and hyponatremia: Secondary | ICD-10-CM | POA: Diagnosis present

## 2021-04-18 DIAGNOSIS — Z20822 Contact with and (suspected) exposure to covid-19: Secondary | ICD-10-CM | POA: Diagnosis present

## 2021-04-18 DIAGNOSIS — F411 Generalized anxiety disorder: Secondary | ICD-10-CM | POA: Diagnosis present

## 2021-04-18 DIAGNOSIS — E785 Hyperlipidemia, unspecified: Secondary | ICD-10-CM | POA: Diagnosis present

## 2021-04-18 DIAGNOSIS — E162 Hypoglycemia, unspecified: Secondary | ICD-10-CM | POA: Diagnosis not present

## 2021-04-18 DIAGNOSIS — E1169 Type 2 diabetes mellitus with other specified complication: Secondary | ICD-10-CM

## 2021-04-18 DIAGNOSIS — E11649 Type 2 diabetes mellitus with hypoglycemia without coma: Secondary | ICD-10-CM | POA: Diagnosis present

## 2021-04-18 DIAGNOSIS — F32A Depression, unspecified: Secondary | ICD-10-CM | POA: Diagnosis present

## 2021-04-18 DIAGNOSIS — E876 Hypokalemia: Secondary | ICD-10-CM

## 2021-04-18 DIAGNOSIS — E1149 Type 2 diabetes mellitus with other diabetic neurological complication: Secondary | ICD-10-CM | POA: Diagnosis present

## 2021-04-18 DIAGNOSIS — I952 Hypotension due to drugs: Secondary | ICD-10-CM | POA: Diagnosis present

## 2021-04-18 DIAGNOSIS — E1142 Type 2 diabetes mellitus with diabetic polyneuropathy: Secondary | ICD-10-CM | POA: Diagnosis present

## 2021-04-18 DIAGNOSIS — J449 Chronic obstructive pulmonary disease, unspecified: Secondary | ICD-10-CM

## 2021-04-18 DIAGNOSIS — E1151 Type 2 diabetes mellitus with diabetic peripheral angiopathy without gangrene: Secondary | ICD-10-CM | POA: Diagnosis present

## 2021-04-18 DIAGNOSIS — I2510 Atherosclerotic heart disease of native coronary artery without angina pectoris: Secondary | ICD-10-CM | POA: Diagnosis present

## 2021-04-18 DIAGNOSIS — D649 Anemia, unspecified: Secondary | ICD-10-CM | POA: Diagnosis present

## 2021-04-18 DIAGNOSIS — K219 Gastro-esophageal reflux disease without esophagitis: Secondary | ICD-10-CM | POA: Diagnosis present

## 2021-04-18 DIAGNOSIS — N183 Chronic kidney disease, stage 3 unspecified: Secondary | ICD-10-CM

## 2021-04-18 DIAGNOSIS — E860 Dehydration: Secondary | ICD-10-CM | POA: Diagnosis present

## 2021-04-18 DIAGNOSIS — F17210 Nicotine dependence, cigarettes, uncomplicated: Secondary | ICD-10-CM | POA: Diagnosis present

## 2021-04-18 DIAGNOSIS — I129 Hypertensive chronic kidney disease with stage 1 through stage 4 chronic kidney disease, or unspecified chronic kidney disease: Secondary | ICD-10-CM | POA: Diagnosis present

## 2021-04-18 DIAGNOSIS — N1832 Chronic kidney disease, stage 3b: Secondary | ICD-10-CM | POA: Diagnosis present

## 2021-04-18 DIAGNOSIS — I10 Essential (primary) hypertension: Secondary | ICD-10-CM

## 2021-04-18 DIAGNOSIS — T502X5A Adverse effect of carbonic-anhydrase inhibitors, benzothiadiazides and other diuretics, initial encounter: Secondary | ICD-10-CM | POA: Diagnosis present

## 2021-04-18 DIAGNOSIS — E1122 Type 2 diabetes mellitus with diabetic chronic kidney disease: Secondary | ICD-10-CM

## 2021-04-18 LAB — COMPREHENSIVE METABOLIC PANEL
ALT: 11 U/L (ref 0–44)
AST: 18 U/L (ref 15–41)
Albumin: 3.2 g/dL — ABNORMAL LOW (ref 3.5–5.0)
Alkaline Phosphatase: 69 U/L (ref 38–126)
Anion gap: 8 (ref 5–15)
BUN: 22 mg/dL — ABNORMAL HIGH (ref 6–20)
CO2: 22 mmol/L (ref 22–32)
Calcium: 8.1 mg/dL — ABNORMAL LOW (ref 8.9–10.3)
Chloride: 97 mmol/L — ABNORMAL LOW (ref 98–111)
Creatinine, Ser: 1.47 mg/dL — ABNORMAL HIGH (ref 0.44–1.00)
GFR, Estimated: 41 mL/min — ABNORMAL LOW (ref >60)
Glucose, Bld: 105 mg/dL — ABNORMAL HIGH (ref 70–99)
Potassium: 3.7 mmol/L (ref 3.5–5.1)
Sodium: 127 mmol/L — ABNORMAL LOW (ref 135–145)
Total Bilirubin: 0.4 mg/dL (ref 0.3–1.2)
Total Protein: 5.5 g/dL — ABNORMAL LOW (ref 6.5–8.1)

## 2021-04-18 LAB — RETICULOCYTES
Immature Retic Fract: 8.7 % (ref 2.3–15.9)
RBC.: 3.47 MIL/uL — ABNORMAL LOW (ref 3.87–5.11)
Retic Count, Absolute: 65.9 10*3/uL (ref 19.0–186.0)
Retic Ct Pct: 1.9 % (ref 0.4–3.1)

## 2021-04-18 LAB — VITAMIN B12: Vitamin B-12: >7500 pg/mL — ABNORMAL HIGH (ref 180–914)

## 2021-04-18 LAB — IRON AND TIBC
Iron: 139 ug/dL (ref 28–170)
Saturation Ratios: 51 % — ABNORMAL HIGH (ref 10.4–31.8)
TIBC: 274 ug/dL (ref 250–450)
UIBC: 135 ug/dL

## 2021-04-18 LAB — CBG MONITORING, ED
Glucose-Capillary: 113 mg/dL — ABNORMAL HIGH (ref 70–99)
Glucose-Capillary: 99 mg/dL (ref 70–99)
Glucose-Capillary: 133 mg/dL — ABNORMAL HIGH (ref 70–99)
Glucose-Capillary: 115 mg/dL — ABNORMAL HIGH (ref 70–99)
Glucose-Capillary: 138 mg/dL — ABNORMAL HIGH (ref 70–99)
Glucose-Capillary: 195 mg/dL — ABNORMAL HIGH (ref 70–99)

## 2021-04-18 LAB — CBC
HCT: 30.7 % — ABNORMAL LOW (ref 36.0–46.0)
Hemoglobin: 10.6 g/dL — ABNORMAL LOW (ref 12.0–15.0)
MCH: 30.8 pg (ref 26.0–34.0)
MCHC: 34.5 g/dL (ref 30.0–36.0)
MCV: 89.2 fL (ref 80.0–100.0)
Platelets: 212 10*3/uL (ref 150–400)
RBC: 3.44 MIL/uL — ABNORMAL LOW (ref 3.87–5.11)
RDW: 11.9 % (ref 11.5–15.5)
WBC: 5.5 10*3/uL (ref 4.0–10.5)
nRBC: 0 % (ref 0.0–0.2)

## 2021-04-18 LAB — FOLATE: Folate: 8 ng/mL (ref >5.9)

## 2021-04-18 LAB — FERRITIN: Ferritin: 124 ng/mL (ref 11–307)

## 2021-04-18 LAB — MAGNESIUM: Magnesium: 1.5 mg/dL — ABNORMAL LOW (ref 1.7–2.4)

## 2021-04-18 LAB — HIV ANTIBODY (ROUTINE TESTING W REFLEX): HIV Screen 4th Generation wRfx: NONREACTIVE

## 2021-04-18 MED ORDER — ATORVASTATIN CALCIUM 40 MG PO TABS
80.0000 mg | ORAL_TABLET | Freq: Every evening | ORAL | Status: DC
  Administered 2021-04-18: 80 mg via ORAL
  Filled 2021-04-18: qty 2

## 2021-04-18 MED ORDER — MAGNESIUM SULFATE 4 GM/100ML IV SOLN
4.0000 g | Freq: Once | INTRAVENOUS | Status: AC
  Administered 2021-04-18: 4 g via INTRAVENOUS
  Filled 2021-04-18: qty 100

## 2021-04-18 NOTE — Progress Notes (Signed)
PROGRESS NOTE   Felicia Figueroa [facility]  MEQ:[id number] DOB: 12/25/1960 DOA: 04/17/2021 PCP: Gwenlyn Fudge, FNP   No chief complaint on file.  Level of care: Stepdown  Brief Admission History:  60 y.o. female with medical history significant of generalized anxiety disorder, CAD, history of MI, PVD, hypertension, hyperlipidemia, type 2 diabetes mellitus, diabetic peripheral neuropathy, diabetic nephropathy, COPD, active tobacco use, GERD who is coming to the emergency department after having hypotension at the doctor's office.  The patient was following and had not had anything to eat during the day thinking that she was going to get blood work.  She also endorsed dysuria and decreased urinary output.  She denies fever, flank pain, but complains of chills and fatigue.  She has been lightheaded.  She denied rhinorrhea, sore throat, dyspnea, wheezing, chest pain, diaphoresis, PND, orthopnea or pitting edema of the lower extremities.  Denied abdominal pain, nausea, emesis, diarrhea, constipation, melena or hematochezia.  No polyuria, polydipsia, polyphagia or blurred vision.   ED Course: Initial vital signs were temperature 97.8 F, pulse 81, respirations 20, BP 91/64 mmHg O2 sat 100% on room air.  The patient CBG was 32 mg/dL on arrival to the ED but has been able to eat since.  She received a 1000 mL NS bolus as well.   Assessment & Plan:   Principal Problem:   AKI (acute kidney injury) (HCC) Active Problems:   Coronary artery disease involving native coronary artery of native heart without angina pectoris   CKD stage 3 due to type 2 diabetes mellitus (HCC)   Diabetic polyneuropathy associated with type 2 diabetes mellitus (HCC)   Gastroesophageal reflux disease   Generalized anxiety disorder   Hyperlipidemia due to type 2 diabetes mellitus (HCC)   PVD (peripheral vascular disease) with claudication (HCC)   Hypertension   Hypokalemia   Hyponatremia   Tobacco use   COPD (chronic  obstructive pulmonary disease) (HCC)   Normocytic anemia   Hypotension   Hypoglycemia   AKI on CKD stage 3b - exacerbated by diuretic HCTZ and poor oral intake.  Treating with IV fluid hydration. Slight improvement.  Needs more IV fluid today.    Hyponatremia - prerenal from dehydration and HCTZ.  DC HCTZ.  Continue saline IV fluid.  Recheck in AM.   Hypotension - improving with IV fluid hydration, continue.  Holding HCTZ.    Hypokalemia - improved with IV replacement, recheck in AM.   Type 2 diabetes mellitus with neurological complications - stable, holding all home antihyperglycemic meds.   Diabetic peripheral polyneuropathy - resumed home gabapentin therapy.   GAD - resume home escitalopram.   Hyperlipidemia - resuming  home atorvastatin.    Hypoglycemia - directly from sulfonylurea therapy.  DC glipizide permanently. Continue IV dextrose infusion until eating and drinking better.    Hypomagnesemia - IV replacement ordered, recheck in AM.   CAD - continue atorvastatin and aspirin.  Treating hypoglycemia aggressively.   PVD - stable, resumed home meds.   Essential hypertension - Hold all antihypertensives in setting of soft BPs.   Tabacco abuse - Pt strongly advised to stop all tobacco use and nicotine patch offered.   COPD - stable on home bronchodilators.    DVT prophylaxis: SCDs Code Status: full  Family Communication: discussed with patient at bedside  Disposition:  Status is: Observation  The patient remains OBS appropriate and will d/c before 2 midnights.  Dispo: The patient is from: Home  Anticipated d/c is to: Home              Patient currently is not medically stable to d/c.   Difficult to place patient No   Consultants:  N/a  Procedures:  N/a  Antimicrobials:  N/a   Subjective: Pt reports she remains very tired and sleepy.  She would try to eat breakfast if presented.    Objective: Vitals:   04/18/21 0130 04/18/21 0430 04/18/21  0715 04/18/21 0745  BP: 113/73 92/60 126/73 91/77  Pulse: 86 78 85 76  Resp: 16 15 16 17   Temp:      SpO2: 94% 98% 97% 99%  Weight:        Intake/Output Summary (Last 24 hours) at 04/18/2021 1024 Last data filed at 04/17/2021 1948 Gross per 24 hour  Intake 1000 ml  Output --  Net 1000 ml   Filed Weights   04/17/21 1655  Weight: 59.4 kg    Examination:  General exam: Appears calm and comfortable  Dry Mucus Membranes.  Curled up in fetal position.  Respiratory system: Clear to auscultation. Respiratory effort normal. Cardiovascular system: normal S1 & S2 heard. No JVD, murmurs, rubs, gallops or clicks. No pedal edema. Gastrointestinal system: Abdomen is nondistended, soft and nontender. No organomegaly or masses felt. Normal bowel sounds heard. Central nervous system: Alert and oriented. No focal neurological deficits. Extremities: Symmetric 5 x 5 power. Skin: No rashes, lesions or ulcers Psychiatry: Judgement and insight appear normal. Mood & affect flat.   Data Reviewed: I have personally reviewed following labs and imaging studies  CBC: Recent Labs  Lab 04/17/21 1745 04/18/21 0425  WBC 6.4 5.5  NEUTROABS 4.4  --   HGB 10.7* 10.6*  HCT 31.1* 30.7*  MCV 89.1 89.2  PLT 221 212    Basic Metabolic Panel: Recent Labs  Lab 04/17/21 1745 04/18/21 0425  NA 120* 127*  K 3.0* 3.7  CL 90* 97*  CO2 21* 22  GLUCOSE 106* 105*  BUN 28* 22*  CREATININE 2.10* 1.47*  CALCIUM 8.0* 8.1*  MG 1.6* 1.5*  PHOS 3.6  --     GFR: Estimated Creatinine Clearance: 36.6 mL/min (A) (by C-G formula based on SCr of 1.47 mg/dL (H)).  Liver Function Tests: Recent Labs  Lab 04/17/21 1745 04/18/21 0425  AST 18 18  ALT 11 11  ALKPHOS 73 69  BILITOT 0.4 0.4  PROT 5.8* 5.5*  ALBUMIN 3.3* 3.2*    CBG: Recent Labs  Lab 04/17/21 2129 04/18/21 0118 04/18/21 0436 04/18/21 0626 04/18/21 0930  GLUCAP 115* 113* 99 133* 115*    Recent Results (from the past 240 hour(s))  Resp  Panel by RT-PCR (Flu A&B, Covid) Nasopharyngeal Swab     Status: None   Collection Time: 04/17/21 10:26 PM   Specimen: Nasopharyngeal Swab; Nasopharyngeal(NP) swabs in vial transport medium  Result Value Ref Range Status   SARS Coronavirus 2 by RT PCR NEGATIVE NEGATIVE Final    Comment: (NOTE) SARS-CoV-2 target nucleic acids are NOT DETECTED.  The SARS-CoV-2 RNA is generally detectable in upper respiratory specimens during the acute phase of infection. The lowest concentration of SARS-CoV-2 viral copies this assay can detect is 138 copies/mL. A negative result does not preclude SARS-Cov-2 infection and should not be used as the sole basis for treatment or other patient management decisions. A negative result may occur with  improper specimen collection/handling, submission of specimen other than nasopharyngeal swab, presence of viral mutation(s) within the areas targeted by this  assay, and inadequate number of viral copies(<138 copies/mL). A negative result must be combined with clinical observations, patient history, and epidemiological information. The expected result is Negative.  Fact Sheet for Patients:  BloggerCourse.com  Fact Sheet for Healthcare Providers:  SeriousBroker.it  This test is no t yet approved or cleared by the [place] FDA and  has been authorized for detection and/or diagnosis of SARS-CoV-2 by FDA under an Emergency Use Authorization (EUA). This EUA will remain  in effect (meaning this test can be used) for the duration of the COVID-19 declaration under Section 564(b)(1) of the Act, 21 U.S.C.section 360bbb-3(b)(1), unless the authorization is terminated  or revoked sooner.       Influenza A by PCR NEGATIVE NEGATIVE Final   Influenza B by PCR NEGATIVE NEGATIVE Final    Comment: (NOTE) The Xpert Xpress SARS-CoV-2/FLU/RSV plus assay is intended as an aid in the diagnosis of influenza from Nasopharyngeal  swab specimens and should not be used as a sole basis for treatment. Nasal washings and aspirates are unacceptable for Xpert Xpress SARS-CoV-2/FLU/RSV testing.  Fact Sheet for Patients: BloggerCourse.com  Fact Sheet for Healthcare Providers: SeriousBroker.it  This test is not yet approved or cleared by the [place] FDA and has been authorized for detection and/or diagnosis of SARS-CoV-2 by FDA under an Emergency Use Authorization (EUA). This EUA will remain in effect (meaning this test can be used) for the duration of the COVID-19 declaration under Section 564(b)(1) of the Act, 21 U.S.C. section 360bbb-3(b)(1), unless the authorization is terminated or revoked.  Performed at [facility], [address] [place], [place] [address]      Radiology Studies: [facility] Chest [facility] 1 View  Result Date: 04/17/2021 CLINICAL DATA:  Weakness.  Hypotensive.  Dizziness. EXAM: PORTABLE CHEST 1 VIEW COMPARISON:  Radiograph 02/19/2020 FINDINGS: Upper normal heart size. Normal unchanged mediastinal contours. There is mild interstitial thickening that is new from prior exam. No focal airspace disease. No pneumothorax. No large pleural effusion. No acute osseous abnormalities are seen. IMPRESSION: Mild interstitial thickening, new from prior exam, may be pulmonary edema or atypical infection. Electronically Signed   By: Narda Rutherford M.D.   On: 04/17/2021 17:59    Scheduled Meds:  aspirin EC  81 mg Oral Daily   escitalopram  20 mg Oral Daily   ezetimibe  10 mg Oral Daily   gabapentin  600 mg Oral BID   pantoprazole  40 mg Oral Daily   umeclidinium-vilanterol  1 puff Inhalation Daily   Continuous Infusions:  dextrose 5 % and 0.9 % NaCl with KCl 20 mEq/L 125 mL/hr at 04/18/21 4888     LOS: 0 days   Time spent: 35 mins   Hason Ofarrell Laural Benes, MD How to contact the [facility] Attending or Consulting provider 7A - 7P or covering provider during after hours 7P  -7A, for this patient?  Check the care team in [facility] and look for a) attending/consulting [facility] provider listed and b) the [facility] team listed Log into [email or web address] and use [place]'s universal password to access. If you do not have the password, please contact the hospital operator. Locate the [facility] provider you are looking for under [facility] and page to a number that you can be directly reached. If you still have difficulty reaching the provider, please page the [facility] (Director on Call) for the Hospitalists listed on amion for assistance.  04/18/2021, 10:24 AM

## 2021-04-19 DIAGNOSIS — Z720 Tobacco use: Secondary | ICD-10-CM

## 2021-04-19 LAB — CBC
HCT: 32.9 % — ABNORMAL LOW (ref 36.0–46.0)
Hemoglobin: 10.9 g/dL — ABNORMAL LOW (ref 12.0–15.0)
MCH: 30.3 pg (ref 26.0–34.0)
MCHC: 33.1 g/dL (ref 30.0–36.0)
MCV: 91.4 fL (ref 80.0–100.0)
Platelets: 250 10*3/uL (ref 150–400)
RBC: 3.6 MIL/uL — ABNORMAL LOW (ref 3.87–5.11)
RDW: 12.4 % (ref 11.5–15.5)
WBC: 10.9 10*3/uL — ABNORMAL HIGH (ref 4.0–10.5)
nRBC: 0 % (ref 0.0–0.2)

## 2021-04-19 LAB — BASIC METABOLIC PANEL
Anion gap: 5 (ref 5–15)
BUN: 13 mg/dL (ref 6–20)
CO2: 22 mmol/L (ref 22–32)
Calcium: 8.6 mg/dL — ABNORMAL LOW (ref 8.9–10.3)
Chloride: 107 mmol/L (ref 98–111)
Creatinine, Ser: 1.22 mg/dL — ABNORMAL HIGH (ref 0.44–1.00)
GFR, Estimated: 51 mL/min — ABNORMAL LOW (ref >60)
Glucose, Bld: 131 mg/dL — ABNORMAL HIGH (ref 70–99)
Potassium: 4.8 mmol/L (ref 3.5–5.1)
Sodium: 134 mmol/L — ABNORMAL LOW (ref 135–145)

## 2021-04-19 LAB — MAGNESIUM: Magnesium: 2 mg/dL (ref 1.7–2.4)

## 2021-04-19 NOTE — Discharge Instructions (Signed)
IMPORTANT INFORMATION: PAY CLOSE ATTENTION   PHYSICIAN DISCHARGE INSTRUCTIONS  Follow with Primary care provider  Joyce, Britney F, FNP  and other consultants as instructed by your Hospitalist Physician  SEEK MEDICAL CARE OR RETURN TO EMERGENCY ROOM IF SYMPTOMS COME BACK, WORSEN OR NEW PROBLEM DEVELOPS   Please note: You were cared for by a hospitalist during your hospital stay. Every effort will be made to forward records to your primary care provider.  You can request that your primary care provider send for your hospital records if they have not received them.  Once you are discharged, your primary care physician will handle any further medical issues. Please note that NO REFILLS for any discharge medications will be authorized once you are discharged, as it is imperative that you return to your primary care physician (or establish a relationship with a primary care physician if you do not have one) for your post hospital discharge needs so that they can reassess your need for medications and monitor your lab values.  Please get a complete blood count and chemistry panel checked by your Primary MD at your next visit, and again as instructed by your Primary MD.  Get Medicines reviewed and adjusted: Please take all your medications with you for your next visit with your Primary MD  Laboratory/radiological data: Please request your Primary MD to go over all hospital tests and procedure/radiological results at the follow up, please ask your primary care provider to get all Hospital records sent to his/her office.  In some cases, they will be blood work, cultures and biopsy results pending at the time of your discharge. Please request that your primary care provider follow up on these results.  If you are diabetic, please bring your blood sugar readings with you to your follow up appointment with primary care.    Please call and make your follow up appointments as soon as possible.    Also  Note the following: If you experience worsening of your admission symptoms, develop shortness of breath, life threatening emergency, suicidal or homicidal thoughts you must seek medical attention immediately by calling 911 or calling your MD immediately  if symptoms less severe.  You must read complete instructions/literature along with all the possible adverse reactions/side effects for all the Medicines you take and that have been prescribed to you. Take any new Medicines after you have completely understood and accpet all the possible adverse reactions/side effects.   Do not drive when taking Pain medications or sleeping medications (Benzodiazepines)  Do not take more than prescribed Pain, Sleep and Anxiety Medications. It is not advisable to combine anxiety,sleep and pain medications without talking with your primary care practitioner  Special Instructions: If you have smoked or chewed Tobacco  in the last 2 yrs please stop smoking, stop any regular Alcohol  and or any Recreational drug use.  Wear Seat belts while driving.  Do not drive if taking any narcotic, mind altering or controlled substances or recreational drugs or alcohol.       

## 2021-04-19 NOTE — Evaluation (Signed)
Physical Therapy Evaluation Patient Details Name: Felicia Figueroa MRN: [id number] DOB: 02-05-1961 Today's Date: 04/19/2021   History of Present Illness  Felicia Figueroa is a 60 y.o. female with medical history significant of generalized anxiety disorder, CAD, history of MI, PVD, hypertension, hyperlipidemia, type 2 diabetes mellitus, diabetic peripheral neuropathy, diabetic nephropathy, COPD, active tobacco use, GERD who is coming to the emergency department after having hypotension at the doctor's office.  The patient was following and had not had anything to eat during the day thinking that she was going to get blood work.  She also endorsed dysuria and decreased urinary output.  She denies fever, flank pain, but complains of chills and fatigue.  She has been lightheaded.  She denied rhinorrhea, sore throat, dyspnea, wheezing, chest pain, diaphoresis, PND, orthopnea or pitting edema of the lower extremities.  Denied abdominal pain, nausea, emesis, diarrhea, constipation, melena or hematochezia.  No polyuria, polydipsia, polyphagia or blurred vision.   Clinical Impression  Patient functioning near baseline for functional mobility and gait demonstrating good return for bed mobility, transfers and ambulation in room/hallway without loss of balance.  Patient tolerated sitting up in chair after therapy.  Plan:  Patient discharged from physical therapy to care of nursing for ambulation daily as tolerated for length of stay.       Follow Up Recommendations Home health PT;Supervision - Intermittent    Equipment Recommendations  Other (comment) (Rollator walker with seat and brakes)    Recommendations for Other Services       Precautions / Restrictions Precautions Precautions: Fall Restrictions Weight Bearing Restrictions: No      Mobility  Bed Mobility Overal bed mobility: Modified Independent                  Transfers Overall transfer level: Modified  independent Equipment used: Rolling walker (2 wheeled)                Ambulation/Gait Ambulation/Gait assistance: Supervision;Modified independent (Device/Increase time) Gait Distance (Feet): 120 Feet Assistive device: Rolling walker (2 wheeled) Gait Pattern/deviations: Decreased step length - right;Decreased step length - left;Decreased stride length;Drifts right/left Gait velocity: decreased   General Gait Details: slightly labored cadence with occasional drifting left/right without loss of balance  Stairs            Wheelchair Mobility    Modified Rankin (Stroke Patients Only)       Balance Overall balance assessment: Needs assistance Sitting-balance support: Feet supported;No upper extremity supported Sitting balance-Leahy Scale: Good Sitting balance - Comments: seated at EOB   Standing balance support: During functional activity;Bilateral upper extremity supported Standing balance-Leahy Scale: Fair Standing balance comment: fair/good using RW                             Pertinent Vitals/Pain Pain Assessment: No/denies pain    Home Living Family/patient expects to be discharged to:: Private residence Living Arrangements: Alone Available Help at Discharge: Friend(s);Family;Available PRN/intermittently Type of Home: Apartment Home Access: Level entry     Home Layout: One level Home Equipment: Cane - single point;Grab bars - tub/shower      Prior Function Level of Independence: Independent with assistive device(s)         Comments: community Geologist, 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)     Journalist, newspaper        Extremity/Trunk Assessment   Upper Extremity Assessment Upper Extremity Assessment: Overall WFL for tasks assessed  Lower Extremity Assessment Lower Extremity Assessment: Generalized weakness    Cervical / Trunk Assessment Cervical / Trunk Assessment: Normal  Communication   Communication: No difficulties   Cognition Arousal/Alertness: Awake/alert Behavior During Therapy: WFL for tasks assessed/performed Overall Cognitive Status: Within Functional Limits for tasks assessed                                        General Comments      Exercises     Assessment/Plan    PT Assessment All further PT needs can be met in the next venue of care  PT Problem List Decreased strength;Decreased activity tolerance;Decreased mobility;Decreased balance       PT Treatment Interventions      PT Goals (Current goals can be found in the Care Plan section)  Acute Rehab PT Goals Patient Stated Goal: return home with friends to assist PT Goal Formulation: With patient Time For Goal Achievement: 04/19/21 Potential to Achieve Goals: Good    Frequency     Barriers to discharge        Co-evaluation               AM-PAC PT "6 Clicks" Mobility  Outcome Measure Help needed turning from your back to your side while in a flat bed without using bedrails?: None Help needed moving from lying on your back to sitting on the side of a flat bed without using bedrails?: None Help needed moving to and from a bed to a chair (including a wheelchair)?: None Help needed standing up from a chair using your arms (e.g., wheelchair or bedside chair)?: None Help needed to walk in hospital room?: A Little Help needed climbing 3-5 steps with a railing? : A Little 6 Click Score: 22    End of Session   Activity Tolerance: Patient tolerated treatment well Patient left: in chair Nurse Communication: Mobility status PT Visit Diagnosis: Unsteadiness on feet (R26.81);Other abnormalities of gait and mobility (R26.89);Muscle weakness (generalized) (M62.81)    Time: 1517-6160 PT Time Calculation (min) (ACUTE ONLY): 15 min   Charges:   PT Evaluation $PT Eval Low Complexity: 1 Low PT Treatments $Therapeutic Activity: 8-22 mins        10:54 AM, 04/19/21 Lonell Grandchild, MPT Physical Therapist  with [facility] [phone] office [phone] mobile phone

## 2021-04-19 NOTE — Progress Notes (Signed)
Patient has been picked up at this time.

## 2021-04-19 NOTE — Progress Notes (Addendum)
Patient was given discharge instructions, IV's removed, and scrubs were provided. Patient was then found at the elevators with her belongings and a walker. This nurse asked patient to wait in room for her ride to arrive but the patient refused stating that her ride was downstairs. This nurse accompanied patient downstairs but no ride was here or on the way at this time. Her ride was called and patient insisted she was waiting in the lobby. Patient verbalized understanding and this nurse returned to floor.

## 2021-04-19 NOTE — Discharge Summary (Signed)
Physician Discharge Summary  Felicia Figueroa [facility] YCX:[id number] DOB: 1961/10/01 DOA: 04/17/2021  PCP: Loman Brooklyn, FNP  Admit date: 04/17/2021 Discharge date: 04/19/2021  Admitted From:  Home  Disposition: Home   Recommendations for Outpatient Follow-up:  Follow up with PCP in 1-2 weeks Please obtain BMP/CBC in 1-2 weeks Please follow up on blood pressure monitoring DC HCTZ due to severe hyponatremia DC sulfonylurea medication permanently due to severe persistent hypoglycemia Monitor CBG closely.    Discharge Condition: STABLE   CODE STATUS: FULL DIET: carb modified    Brief Hospitalization Summary: Please see all hospital notes, images, labs for full details of the hospitalization. ADMISSION HPI: Felicia Figueroa is a 60 y.o. female with medical history significant of generalized anxiety disorder, CAD, history of MI, PVD, hypertension, hyperlipidemia, type 2 diabetes mellitus, diabetic peripheral neuropathy, diabetic nephropathy, COPD, active tobacco use, GERD who is coming to the emergency department after having hypotension at the doctor's office.  The patient was following and had not had anything to eat during the day thinking that she was going to get blood work.  She also endorsed dysuria and decreased urinary output.  She denies fever, flank pain, but complains of chills and fatigue.  She has been lightheaded.  She denied rhinorrhea, sore throat, dyspnea, wheezing, chest pain, diaphoresis, PND, orthopnea or pitting edema of the lower extremities.  Denied abdominal pain, nausea, emesis, diarrhea, constipation, melena or hematochezia.  No polyuria, polydipsia, polyphagia or blurred vision.   ED Course: Initial vital signs were temperature 97.8 F, pulse 81, respirations 20, BP 91/64 mmHg O2 sat 100% on room air.  The patient CBG was 32 mg/dL on arrival to the ED but has been able to eat since.  She received a 1000 mL NS bolus as well.   Lab work: CBC shows a white count of 6.4  with a normal differential, hemoglobin 10.7 g/dL (down from 14.7 g/dL on 02/19/2021) and platelets were 221.  Sodium was 120, potassium 3.0, chloride 90 and CO2 21 mmol/L with a normal anion gap.  Glucose is 106, BUN 28, creatinine 2.10 and calcium 8.0 mg/dL.  Total protein is 5.9 and albumin 3.3 g/dL.  The rest of the hepatic functions are within normal range.  Troponin and lactic acid level were normal.   Imaging: A portable 1 view chest radiograph shows mild interstitial thickening which is new from previous examination.  Consider pulmonary edema or atypical infection.  Please see image and full radiology report for further detail.  HOSPITAL COURSE BY PROBLEM   AKI on CKD stage 3b - exacerbated by diuretic HCTZ and poor oral intake.  Treated with IV fluid hydration. Creatinine has improved today to 1.22.   DISCONTINUE HCTZ MEDICATION. Holding lisinopril due to soft BPs.       Hyponatremia - Improved to 134.  Thought prerenal from dehydration and HCTZ.  DC HCTZ.  Treated with normal saline IV fluid.  Recheck outpatient with PCP.   Hypotension - improved with IV fluid hydration.  DC HCTZ and holding lisinopril due to soft BP readings.     Hypokalemia - REPLETED.  improved with IV replacement. DC HCTZ.     Type 2 diabetes mellitus with neurological complications - no further hypoglycemia.  DC glipizide.    Diabetic peripheral polyneuropathy - resumed home gabapentin therapy.   GAD - resume home escitalopram.   Hyperlipidemia - resuming  home atorvastatin.     Hypoglycemia - directly from sulfonylurea therapy.  DC glipizide permanently. Pt had  to be maintained on IV dextrose infusion until eating and drinking better and blood sugars improved.     Hypomagnesemia - IV replacement ordered and REPLETED.    CAD - continue atorvastatin and aspirin.  Treated hypoglycemia aggressively as noted above.   PVD - stable, resumed home meds.   Essential hypertension - Hold all antihypertensives in  setting of soft BPs.   Tabacco abuse - Pt strongly advised to stop all tobacco use and nicotine patch offered.   COPD - stable on home bronchodilators.     DVT prophylaxis: SCDs Code Status: full  Family Communication: discussed with patient at bedside  Disposition: Home   Discharge Diagnoses:  Principal Problem:   AKI (acute kidney injury) ([place]) Active Problems:   Coronary artery disease involving native coronary artery of native heart without angina pectoris   CKD stage 3 due to type 2 diabetes mellitus ([place])   Diabetic polyneuropathy associated with type 2 diabetes mellitus ([place])   Gastroesophageal reflux disease   Generalized anxiety disorder   Hyperlipidemia due to type 2 diabetes mellitus ([place])   PVD (peripheral vascular disease) with claudication (HCC)   Hypertension   Hypokalemia   Hyponatremia   Tobacco use   COPD (chronic obstructive pulmonary disease) (HCC)   Normocytic anemia   Hypotension   Hypoglycemia   Discharge Instructions:  Allergies as of 04/19/2021       Reactions   Cephalexin Nausea And Vomiting   Hydrocodone-acetaminophen Itching, Nausea And Vomiting, Rash   Sulfa Antibiotics Rash   Metformin And Related Diarrhea        Medication List     STOP taking these medications    glipiZIDE 10 MG 24 hr tablet Commonly known as: GLUCOTROL XL   ibuprofen 800 MG tablet Commonly known as: ADVIL   lisinopril-hydrochlorothiazide 10-12.5 MG tablet Commonly known as: ZESTORETIC   traZODone 50 MG tablet Commonly known as: DESYREL       TAKE these medications    albuterol 108 (90 Base) MCG/ACT inhaler Commonly known as: VENTOLIN HFA Inhale 2 puffs into the lungs every 6 (six) hours as needed for wheezing or shortness of breath.   Anoro Ellipta 62.5-25 MCG/INH Aepb Generic drug: umeclidinium-vilanterol Inhale 1 puff into the lungs daily.   aspirin EC 81 MG tablet Take 1 tablet (81 mg total) by mouth daily.   atorvastatin 80 MG  tablet Commonly known as: LIPITOR Take 1 tablet (80 mg total) by mouth daily.   escitalopram 20 MG tablet Commonly known as: Lexapro Take 1 tablet (20 mg total) by mouth daily.   esomeprazole 20 MG packet Commonly known as: NexIUM Take 20 mg by mouth daily before breakfast.   ezetimibe 10 MG tablet Commonly known as: Zetia Take 1 tablet (10 mg total) by mouth daily.   gabapentin 600 MG tablet Commonly known as: NEURONTIN Take 1 tablet (600 mg total) by mouth 2 (two) times daily.   Glyxambi 25-5 MG Tabs Generic drug: Empagliflozin-linaGLIPtin Take 1 tablet by mouth daily.   hydrOXYzine 25 MG tablet Commonly known as: ATARAX/VISTARIL TAKE 1 TABLET UP TO 3 TIMES A DAY FOR ANXIETY   ONE-A-DAY SCOOBY-DOO GUMMIES PO Take 1 tablet by mouth daily.   OneTouch Delica Lancets 44H Misc Use to test blood sugar daily as directed. DX: E11.9   OneTouch Verio Reflect w/Device Kit Use test blood sugar daily in the morning   OneTouch Verio test strip Generic drug: glucose blood Use to test blood sugar daily as directed. DX: E11.9  Follow-up Information     Loman Brooklyn, FNP Follow up today.   Specialty: Family Medicine Why: Hospital Follow Up Contact information: [place] [address] [phone]         Minus Breeding, MD .   Specialty: Cardiology Contact information: [address] [place] [address] [phone]                Allergies  Allergen Reactions   Cephalexin Nausea And Vomiting   Hydrocodone-Acetaminophen Itching, Nausea And Vomiting and Rash   Sulfa Antibiotics Rash   Metformin And Related Diarrhea   Allergies as of 04/19/2021       Reactions   Cephalexin Nausea And Vomiting   Hydrocodone-acetaminophen Itching, Nausea And Vomiting, Rash   Sulfa Antibiotics Rash   Metformin And Related Diarrhea        Medication List     STOP taking these medications    glipiZIDE 10 MG 24 hr  tablet Commonly known as: GLUCOTROL XL   ibuprofen 800 MG tablet Commonly known as: ADVIL   lisinopril-hydrochlorothiazide 10-12.5 MG tablet Commonly known as: ZESTORETIC   traZODone 50 MG tablet Commonly known as: DESYREL       TAKE these medications    albuterol 108 (90 Base) MCG/ACT inhaler Commonly known as: VENTOLIN HFA Inhale 2 puffs into the lungs every 6 (six) hours as needed for wheezing or shortness of breath.   Anoro Ellipta 62.5-25 MCG/INH Aepb Generic drug: umeclidinium-vilanterol Inhale 1 puff into the lungs daily.   aspirin EC 81 MG tablet Take 1 tablet (81 mg total) by mouth daily.   atorvastatin 80 MG tablet Commonly known as: LIPITOR Take 1 tablet (80 mg total) by mouth daily.   escitalopram 20 MG tablet Commonly known as: Lexapro Take 1 tablet (20 mg total) by mouth daily.   esomeprazole 20 MG packet Commonly known as: NexIUM Take 20 mg by mouth daily before breakfast.   ezetimibe 10 MG tablet Commonly known as: Zetia Take 1 tablet (10 mg total) by mouth daily.   gabapentin 600 MG tablet Commonly known as: NEURONTIN Take 1 tablet (600 mg total) by mouth 2 (two) times daily.   Glyxambi 25-5 MG Tabs Generic drug: Empagliflozin-linaGLIPtin Take 1 tablet by mouth daily.   hydrOXYzine 25 MG tablet Commonly known as: ATARAX/VISTARIL TAKE 1 TABLET UP TO 3 TIMES A DAY FOR ANXIETY   ONE-A-DAY SCOOBY-DOO GUMMIES PO Take 1 tablet by mouth daily.   OneTouch Delica Lancets 72B Misc Use to test blood sugar daily as directed. DX: E11.9   OneTouch Verio Reflect w/Device Kit Use test blood sugar daily in the morning   OneTouch Verio test strip Generic drug: glucose blood Use to test blood sugar daily as directed. DX: E11.9        Procedures/Studies: DG Chest Port 1 View  Result Date: 04/17/2021 CLINICAL DATA:  Weakness.  Hypotensive.  Dizziness. EXAM: PORTABLE CHEST 1 VIEW COMPARISON:  Radiograph 02/19/2020 FINDINGS: Upper normal heart  size. Normal unchanged mediastinal contours. There is mild interstitial thickening that is new from prior exam. No focal airspace disease. No pneumothorax. No large pleural effusion. No acute osseous abnormalities are seen. IMPRESSION: Mild interstitial thickening, new from prior exam, may be pulmonary edema or atypical infection. Electronically Signed   By: Keith Rake M.D.   On: 04/17/2021 17:59     Subjective: Pt reports that she is eating and drinking better.  No further bouts of hypoglycemia.    Discharge Exam:  Vitals:   04/18/21 2150 04/19/21 0544  BP: 126/73 (!) 108/49  Pulse: 86 88  Resp: 18   Temp: 98.8 F (37.1 C) 99.2 F (37.3 C)  SpO2: 98% 100%   Vitals:   04/18/21 1730 04/18/21 1836 04/18/21 2150 04/19/21 0544  BP: (!) 125/55 127/68 126/73 (!) 108/49  Pulse:  91 86 88  Resp: (!) 21 18 18    Temp:  99.5 F (37.5 C) 98.8 F (37.1 C) 99.2 F (37.3 C)  TempSrc:  Oral Oral Oral  SpO2:   98% 100%  Weight:  59 kg    Height:  5' 6"  (1.676 m)     General: Pt is alert, awake, not in acute distress Cardiovascular: normal S1/S2 +, no rubs, no gallops Respiratory: CTA bilaterally, no wheezing, no rhonchi Abdominal: Soft, NT, ND, bowel sounds + Extremities: no edema, no cyanosis   The results of significant diagnostics from this hospitalization (including imaging, microbiology, ancillary and laboratory) are listed below for reference.     Microbiology: Recent Results (from the past 240 hour(s))  Resp Panel by RT-PCR (Flu A&B, Covid) Nasopharyngeal Swab     Status: None   Collection Time: 04/17/21 10:26 PM   Specimen: Nasopharyngeal Swab; Nasopharyngeal(NP) swabs in vial transport medium  Result Value Ref Range Status   SARS Coronavirus 2 by RT PCR NEGATIVE NEGATIVE Final    Comment: (NOTE) SARS-CoV-2 target nucleic acids are NOT DETECTED.  The SARS-CoV-2 RNA is generally detectable in upper respiratory specimens during the acute phase of infection. The  lowest concentration of SARS-CoV-2 viral copies this assay can detect is 138 copies/mL. A negative result does not preclude SARS-Cov-2 infection and should not be used as the sole basis for treatment or other patient management decisions. A negative result may occur with  improper specimen collection/handling, submission of specimen other than nasopharyngeal swab, presence of viral mutation(s) within the areas targeted by this assay, and inadequate number of viral copies(<138 copies/mL). A negative result must be combined with clinical observations, patient history, and epidemiological information. The expected result is Negative.  Fact Sheet for Patients:  EntrepreneurPulse.com.au  Fact Sheet for Healthcare Providers:  IncredibleEmployment.be  This test is no t yet approved or cleared by the [place] FDA and  has been authorized for detection and/or diagnosis of SARS-CoV-2 by FDA under an Emergency Use Authorization (EUA). This EUA will remain  in effect (meaning this test can be used) for the duration of the COVID-19 declaration under Section 564(b)(1) of the Act, 21 U.S.C.section 360bbb-3(b)(1), unless the authorization is terminated  or revoked sooner.       Influenza A by PCR NEGATIVE NEGATIVE Final   Influenza B by PCR NEGATIVE NEGATIVE Final    Comment: (NOTE) The Xpert Xpress SARS-CoV-2/FLU/RSV plus assay is intended as an aid in the diagnosis of influenza from Nasopharyngeal swab specimens and should not be used as a sole basis for treatment. Nasal washings and aspirates are unacceptable for Xpert Xpress SARS-CoV-2/FLU/RSV testing.  Fact Sheet for Patients: EntrepreneurPulse.com.au  Fact Sheet for Healthcare Providers: IncredibleEmployment.be  This test is not yet approved or cleared by the [place] FDA and has been authorized for detection and/or diagnosis of SARS-CoV-2 by FDA under  an Emergency Use Authorization (EUA). This EUA will remain in effect (meaning this test can be used) for the duration of the COVID-19 declaration under Section 564(b)(1) of the Act, 21 U.S.C. section 360bbb-3(b)(1), unless the authorization is terminated or revoked.  Performed at [facility], [phone]  [address], [place], [place] [address]      Labs: BNP (last 3 results) No results for input(s): BNP in the last 8760 hours. Basic Metabolic Panel: Recent Labs  Lab 04/17/21 1745 04/18/21 0425 04/19/21 0553  NA 120* 127* 134*  K 3.0* 3.7 4.8  CL 90* 97* 107  CO2 21* 22 22  GLUCOSE 106* 105* 131*  BUN 28* 22* 13  CREATININE 2.10* 1.47* 1.22*  CALCIUM 8.0* 8.1* 8.6*  MG 1.6* 1.5* 2.0  PHOS 3.6  --   --    Liver Function Tests: Recent Labs  Lab 04/17/21 1745 04/18/21 0425  AST 18 18  ALT 11 11  ALKPHOS 73 69  BILITOT 0.4 0.4  PROT 5.8* 5.5*  ALBUMIN 3.3* 3.2*   No results for input(s): LIPASE, AMYLASE in the last 168 hours. No results for input(s): AMMONIA in the last 168 hours. CBC: Recent Labs  Lab 04/17/21 1745 04/18/21 0425 04/19/21 0553  WBC 6.4 5.5 10.9*  NEUTROABS 4.4  --   --   HGB 10.7* 10.6* 10.9*  HCT 31.1* 30.7* 32.9*  MCV 89.1 89.2 91.4  PLT 221 212 250   Cardiac Enzymes: Recent Labs  Lab 04/17/21 1745  CKTOTAL 71   BNP: Invalid input(s): POCBNP CBG: Recent Labs  Lab 04/18/21 0436 04/18/21 0626 04/18/21 0930 04/18/21 1151 04/18/21 1722  GLUCAP 99 133* 115* 138* 195*   D-Dimer No results for input(s): DDIMER in the last 72 hours. Hgb A1c No results for input(s): HGBA1C in the last 72 hours. Lipid Profile No results for input(s): CHOL, HDL, LDLCALC, TRIG, CHOLHDL, LDLDIRECT in the last 72 hours. Thyroid function studies No results for input(s): TSH, T4TOTAL, T3FREE, THYROIDAB in the last 72 hours.  Invalid input(s): FREET3 Anemia work up Recent Labs    04/18/21 0425  VITAMINB12 >7,500*  FOLATE 8.0  FERRITIN 124  TIBC 274   IRON 139  RETICCTPCT 1.9   Urinalysis    Component Value Date/Time   COLORURINE YELLOW 02/19/2020 2106   APPEARANCEUR HAZY (A) 02/19/2020 2106   LABSPEC 1.009 02/19/2020 2106   PHURINE 5.0 02/19/2020 2106   GLUCOSEU 150 (A) 02/19/2020 2106   HGBUR NEGATIVE 02/19/2020 2106   BILIRUBINUR NEGATIVE 02/19/2020 2106   KETONESUR NEGATIVE 02/19/2020 2106   PROTEINUR NEGATIVE 02/19/2020 2106   NITRITE NEGATIVE 02/19/2020 2106   LEUKOCYTESUR SMALL (A) 02/19/2020 2106   Sepsis Labs Invalid input(s): PROCALCITONIN,  WBC,  LACTICIDVEN Microbiology Recent Results (from the past 240 hour(s))  Resp Panel by RT-PCR (Flu A&B, Covid) Nasopharyngeal Swab     Status: None   Collection Time: 04/17/21 10:26 PM   Specimen: Nasopharyngeal Swab; Nasopharyngeal(NP) swabs in vial transport medium  Result Value Ref Range Status   SARS Coronavirus 2 by RT PCR NEGATIVE NEGATIVE Final    Comment: (NOTE) SARS-CoV-2 target nucleic acids are NOT DETECTED.  The SARS-CoV-2 RNA is generally detectable in upper respiratory specimens during the acute phase of infection. The lowest concentration of SARS-CoV-2 viral copies this assay can detect is 138 copies/mL. A negative result does not preclude SARS-Cov-2 infection and should not be used as the sole basis for treatment or other patient management decisions. A negative result may occur with  improper specimen collection/handling, submission of specimen other than nasopharyngeal swab, presence of viral mutation(s) within the areas targeted by this assay, and inadequate number of viral copies(<138 copies/mL). A negative result must be combined with clinical observations, patient history, and epidemiological information. The expected result is Negative.  Fact  Sheet for Patients:  EntrepreneurPulse.com.au  Fact Sheet for Healthcare Providers:  IncredibleEmployment.be  This test is no t yet approved or cleared by the [place] FDA and  has been authorized for detection and/or diagnosis of SARS-CoV-2 by FDA under an Emergency Use Authorization (EUA). This EUA will remain  in effect (meaning this test can be used) for the duration of the COVID-19 declaration under Section 564(b)(1) of the Act, 21 U.S.C.section 360bbb-3(b)(1), unless the authorization is terminated  or revoked sooner.       Influenza A by PCR NEGATIVE NEGATIVE Final   Influenza B by PCR NEGATIVE NEGATIVE Final    Comment: (NOTE) The Xpert Xpress SARS-CoV-2/FLU/RSV plus assay is intended as an aid in the diagnosis of influenza from Nasopharyngeal swab specimens and should not be used as a sole basis for treatment. Nasal washings and aspirates are unacceptable for Xpert Xpress SARS-CoV-2/FLU/RSV testing.  Fact Sheet for Patients: EntrepreneurPulse.com.au  Fact Sheet for Healthcare Providers: IncredibleEmployment.be  This test is not yet approved or cleared by the [place] FDA and has been authorized for detection and/or diagnosis of SARS-CoV-2 by FDA under an Emergency Use Authorization (EUA). This EUA will remain in effect (meaning this test can be used) for the duration of the COVID-19 declaration under Section 564(b)(1) of the Act, 21 U.S.C. section 360bbb-3(b)(1), unless the authorization is terminated or revoked.  Performed at [facility], [address], [place], [place] [address]     Time coordinating discharge: 37 minutes   SIGNED:  Irwin Brakeman, MD  [facility] 04/19/2021, 10:29 AM How to contact the [facility] Attending or Consulting provider [place] or covering provider during after hours [place], for this patient?  Check the care team in [facility] and look for a) attending/consulting TRH provider listed and b) the [facility] team listed Log into [email or web address] and use 's universal password to access. If you do not have the password, please contact the hospital  operator. Locate the [facility] provider you are looking for under [facility] and page to a number that you can be directly reached. If you still have difficulty reaching the provider, please page the [facility] (Director on Call) for the Hospitalists listed on amion for assistance.

## 2021-04-19 NOTE — TOC Transition Note (Signed)
Transition of Care [facility]) - CM/SW Discharge Note   Patient Details  Name: Felicia Figueroa MRN: [id number] Date of Birth: August 19, 1961  Transition of [facility]) CM/SW Contact:  Barry Brunner, LCSW Phone Number: 04/19/2021, 12:59 PM   Clinical Narrative:    CSW notified of patient's readiness for discharge and HH recommendation. Patient and family agreeable to [facility]. CSW referred patient to [facility]. Denyse Amass with [facility] agreeable to provide [facility] services. TOC to signing off.    Final next level of care: Home w Home Health Services Barriers to Discharge: Barriers Resolved   Patient Goals and CMS Choice Patient states their goals for this hospitalization and ongoing recovery are:: Return home with [facility] CMS Medicare.gov Compare Post Acute Care list provided to:: Patient Choice offered to / list presented to : Patient  Discharge Placement                    Patient and family notified of of transfer: 04/19/21  Discharge Plan and Services                          HH Arranged: PT [facility] Agency: [facility] : [facility] Date [facility] Agency Contacted: 04/19/21 Time HH Agency Contacted: [phone] Representative spoke with at [facility] Agency: Denyse Amass  Social Determinants of Health (SDOH) Interventions     Readmission Risk Interventions No flowsheet data found.

## 2021-04-22 ENCOUNTER — Telehealth: Payer: Self-pay

## 2021-04-22 NOTE — Telephone Encounter (Signed)
Transition Care Management Unsuccessful Follow-up Telephone Call  Date of discharge and from where:  Felicia Figueroa 04/19/21  Diagnosis: Kidney Failure   Attempts:  1st Attempt  Reason for unsuccessful TCM follow-up call:  Left voice message on patient cell

## 2021-04-22 NOTE — Telephone Encounter (Signed)
Transition Care Management Unsuccessful Follow-up Telephone Call  Date of discharge and from where:  Felicia Figueroa 04/19/21  Diagnosis: Kidney failure   Attempts:  2nd Attempt  Reason for unsuccessful TCM follow-up call:  Missing or invalid number Man answere and said I had wrong number on Tasha's cell (pt daughter)

## 2021-04-22 NOTE — Telephone Encounter (Signed)
Transition Care Management Unsuccessful Follow-up Telephone Call  Date of discharge and from where:  Felicia Figueroa 04/19/21  Diagnosis: Kidney failure   Attempts:  3rd Attempt  Reason for unsuccessful TCM follow-up call:  Left voice message on her daughter, Felicia Figueroa's cell

## 2021-05-09 ENCOUNTER — Encounter: Payer: Self-pay | Admitting: Family Medicine

## 2021-05-09 ENCOUNTER — Inpatient Hospital Stay: Payer: Self-pay | Admitting: Family Medicine

## 2021-05-20 ENCOUNTER — Other Ambulatory Visit: Payer: Self-pay | Admitting: Family Medicine

## 2021-05-20 ENCOUNTER — Telehealth: Payer: Self-pay | Admitting: Family Medicine

## 2021-05-20 NOTE — Telephone Encounter (Signed)
Spoke with patient, appointment scheduled 05/22/21 with Britney.

## 2021-05-22 ENCOUNTER — Other Ambulatory Visit: Payer: Self-pay

## 2021-05-22 ENCOUNTER — Ambulatory Visit (INDEPENDENT_AMBULATORY_CARE_PROVIDER_SITE_OTHER): Payer: Medicare Other | Admitting: Family Medicine

## 2021-05-22 ENCOUNTER — Encounter: Payer: Self-pay | Admitting: Family Medicine

## 2021-05-22 VITALS — BP 101/67 | HR 85 | Temp 97.9°F | Ht 66.0 in | Wt 126.0 lb

## 2021-05-22 DIAGNOSIS — E118 Type 2 diabetes mellitus with unspecified complications: Secondary | ICD-10-CM | POA: Diagnosis not present

## 2021-05-22 DIAGNOSIS — E1169 Type 2 diabetes mellitus with other specified complication: Secondary | ICD-10-CM | POA: Diagnosis not present

## 2021-05-22 DIAGNOSIS — E1122 Type 2 diabetes mellitus with diabetic chronic kidney disease: Secondary | ICD-10-CM

## 2021-05-22 DIAGNOSIS — Z8679 Personal history of other diseases of the circulatory system: Secondary | ICD-10-CM | POA: Diagnosis not present

## 2021-05-22 DIAGNOSIS — N183 Chronic kidney disease, stage 3 unspecified: Secondary | ICD-10-CM

## 2021-05-22 DIAGNOSIS — F331 Major depressive disorder, recurrent, moderate: Secondary | ICD-10-CM

## 2021-05-22 DIAGNOSIS — F411 Generalized anxiety disorder: Secondary | ICD-10-CM

## 2021-05-22 DIAGNOSIS — Z794 Long term (current) use of insulin: Secondary | ICD-10-CM

## 2021-05-22 DIAGNOSIS — Z1159 Encounter for screening for other viral diseases: Secondary | ICD-10-CM

## 2021-05-22 DIAGNOSIS — E785 Hyperlipidemia, unspecified: Secondary | ICD-10-CM

## 2021-05-22 LAB — BAYER DCA HB A1C WAIVED: HB A1C (BAYER DCA - WAIVED): 5.8 % (ref <7.0)

## 2021-05-22 NOTE — Patient Instructions (Signed)
Please schedule your eye exam.

## 2021-05-22 NOTE — Progress Notes (Signed)
Assessment & Plan:  1. History of hypertension No longer taking any medications for blood pressure.  2. Type 2 diabetes mellitus with complication, with long-term current use of insulin (HCC) Lab Results  Component Value Date   HGBA1C 8.0 (H) 02/19/2021   HGBA1C 9.2 (H) 11/16/2020   HGBA1C 7.0 (H) 08/16/2020  A1c pending today.  - Home glucose monitoring: continue monitoring - Patient is currently taking a statin. Patient is not taking an ACE-inhibitor/ARB.  - Instruction/counseling given: reminded to get eye exam  Diabetes Health Maintenance Due  Topic Date Due   OPHTHALMOLOGY EXAM  Never done   HEMOGLOBIN A1C  08/22/2021   URINE MICROALBUMIN  11/16/2021   FOOT EXAM  02/19/2022    Lab Results  Component Value Date   LABMICR 4.8 11/16/2020   LABMICR <3.0 10/21/2019   - Anemia Profile B - CMP14+EGFR - Lipid panel - Bayer DCA Hb A1c Waived  3. CKD stage 3 due to type 2 diabetes mellitus ([place]) Labs to assess. - CMP14+EGFR  4. Hyperlipidemia due to type 2 diabetes mellitus ([place]) Labs to assess. - CMP14+EGFR - Lipid panel  5. Generalized anxiety disorder Well controlled on current regimen.   6. Moderate episode of recurrent major depressive disorder (HCC) Well controlled on current regimen.   7. Encounter for hepatitis C screening test for low risk patient - Hepatitis C antibody (reflex, frozen specimen)   Return in about 3 months (around 08/22/2021) for annual physical w. pap.  Hendricks Limes, MSN, APRN, FNP-C [facility]  Subjective:    Patient ID: Felicia Figueroa, female    DOB: July 01, 1961, 60 y.o.   MRN: [id number]  Patient Care Team: Loman Brooklyn, FNP as PCP - General (Family Medicine) Minus Breeding, MD as PCP - Cardiology (Cardiology) Lavera Guise, [facility] (Pharmacist) Harlen Labs, MD as Referring Physician (Optometry)   Chief Complaint:  Chief Complaint  Patient presents with   Hospitalization Follow-up     04/17/21 AP- AKI.    HPI: Felicia Figueroa is a 60 y.o. female presenting on 05/22/2021 for Hospitalization Follow-up (04/17/21 AP- AKI.)  Patient was sent to the ER at our last visit due to hypotension and hypoglycemia. She was treated with IV fluids. Lisinopril-HCTZ and glipizide were discontinued. Patient's blood pressure remains soft. She is monitoring her blood glucose at home and states it stays <100. She is eating healthy.   Depression screen [facility] 2/9 05/22/2021 04/17/2021 04/04/2021  Decreased Interest 0 1 0  Down, Depressed, Hopeless 0 2 0  PHQ - 2 Score 0 3 0  Altered sleeping 0 0 0  Tired, decreased energy 1 2 0  Change in appetite 0 1 0  Feeling bad or failure about yourself  0 1 0  Trouble concentrating 0 0 0  Moving slowly or fidgety/restless 0 1 0  Suicidal thoughts 0 0 0  PHQ-9 Score 1 8 0  Difficult doing work/chores Somewhat difficult Very difficult -  Some recent data might be hidden   GAD 7 : Generalized Anxiety Score 05/22/2021 04/17/2021 04/04/2021 02/19/2021  Nervous, Anxious, on Edge 0 0 0 3  Control/stop worrying 0 3 0 1  Worry too much - different things 0 1 0 3  Trouble relaxing 0 2 0 3  Restless 1 2 0 3  Easily annoyed or irritable 0 2 0 1  Afraid - awful might happen 0 2 0 1  Total GAD 7 Score 1 12 0 15  Anxiety Difficulty  Somewhat difficult Very difficult - Somewhat difficult   New complaints: None   Social history:  Relevant past medical, surgical, family and social history reviewed and updated as indicated. Interim medical history since our last visit reviewed.  Allergies and medications reviewed and updated.  DATA REVIEWED: CHART IN EPIC  ROS: Negative unless specifically indicated above in HPI.    Current Outpatient Medications:    albuterol (VENTOLIN HFA) 108 (90 Base) MCG/ACT inhaler, Inhale 2 puffs into the lungs every 6 (six) hours as needed for wheezing or shortness of breath., Disp: 18 g, Rfl: 2   aspirin EC 81 MG tablet, Take 1 tablet  (81 mg total) by mouth daily., Disp: 90 tablet, Rfl: 3   atorvastatin (LIPITOR) 80 MG tablet, Take 1 tablet (80 mg total) by mouth daily., Disp: 90 tablet, Rfl: 3   Blood Glucose Monitoring Suppl (ONETOUCH VERIO REFLECT) w/Device KIT, Use test blood sugar daily in the morning, Disp: 1 kit, Rfl: 1   Empagliflozin-linaGLIPtin (GLYXAMBI) 25-5 MG TABS, Take 1 tablet by mouth daily., Disp: 90 tablet, Rfl: 1   escitalopram (LEXAPRO) 20 MG tablet, Take 1 tablet (20 mg total) by mouth daily., Disp: 90 tablet, Rfl: 1   esomeprazole (NEXIUM) 20 MG packet, Take 20 mg by mouth daily before breakfast., Disp: 30 each, Rfl: 12   ezetimibe (ZETIA) 10 MG tablet, Take 1 tablet (10 mg total) by mouth daily., Disp: 30 tablet, Rfl: 2   gabapentin (NEURONTIN) 600 MG tablet, Take 1 tablet (600 mg total) by mouth 2 (two) times daily., Disp: 180 tablet, Rfl: 1   glucose blood (ONETOUCH VERIO) test strip, Use to test blood sugar daily as directed. DX: E11.9, Disp: 100 each, Rfl: 12   hydrOXYzine (ATARAX/VISTARIL) 25 MG tablet, TAKE 1 TABLET UP TO 3 TIMES A DAY FOR ANXIETY, Disp: 90 tablet, Rfl: 1   OneTouch Delica Lancets 32G MISC, Use to test blood sugar daily as directed. DX: E11.9, Disp: 100 each, Rfl: 3   Pediatric Multivit-Minerals-C (ONE-A-DAY SCOOBY-DOO GUMMIES PO), Take 1 tablet by mouth daily., Disp: , Rfl:    umeclidinium-vilanterol (ANORO ELLIPTA) 62.5-25 MCG/INH AEPB, Inhale 1 puff into the lungs daily., Disp: 60 each, Rfl: 3   Allergies  Allergen Reactions   Cephalexin Nausea And Vomiting   Hydrocodone-Acetaminophen Itching, Nausea And Vomiting and Rash   Sulfa Antibiotics Rash   Metformin And Related Diarrhea   Past Medical History:  Diagnosis Date   Anxiety    CAD (coronary artery disease)    COPD (chronic obstructive pulmonary disease) (HCC)    Depression    Diabetes ([place])    Diabetic nephropathy (HCC)    History of MI (myocardial infarction)    Hypertension    PVD (peripheral vascular disease)  ([place])     Past Surgical History:  Procedure Laterality Date   AMPUTATION TOE     CARDIAC SURGERY     Stent    KIDNEY SURGERY     stents placed    Social History   Socioeconomic History   Marital status: Divorced    Spouse name: Not on file   Number of children: 2   Years of education: 10   Highest education level: 10th grade  Occupational History   Occupation: Disabled  Tobacco Use   Smoking status: Every Day    Packs/day: 1.00    Years: 35.00    Pack years: 35.00    Types: Cigarettes   Smokeless tobacco: Never  Vaping Use   Vaping Use: Never  used  Substance and Sexual Activity   Alcohol use: Not Currently   Drug use: Never   Sexual activity: Not Currently  Other Topics Concern   Not on file  Social History Narrative   Lives alone.   Two daughters.    Social Determinants of Health   Financial Resource Strain: Not on file  Food Insecurity: Not on file  Transportation Needs: Not on file  Physical Activity: Not on file  Stress: Not on file  Social Connections: Not on file  Intimate Partner Violence: Not on file        Objective:    BP 101/67   Pulse 85   Temp 97.9 F (36.6 C) (Temporal)   Ht _0  (1.676 m)   Wt 126 lb (57.2 kg)   SpO2 97%   BMI 20.34 kg/m   Wt Readings from Last 3 Encounters:  05/22/21 126 lb (57.2 kg)  04/18/21 130 lb (59 kg)  04/04/21 131 lb (59.4 kg)    Physical Exam Vitals reviewed.  Constitutional:      General: She is not in acute distress.    Appearance: Normal appearance. She is normal weight. She is not ill-appearing, toxic-appearing or diaphoretic.  HENT:     Head: Normocephalic and atraumatic.  Eyes:     General: No scleral icterus.       Right eye: No discharge.        Left eye: No discharge.     Conjunctiva/sclera: Conjunctivae normal.  Cardiovascular:     Rate and Rhythm: Normal rate and regular rhythm.     Heart sounds: Normal heart sounds. No murmur heard.   No friction rub. No gallop.  Pulmonary:      Effort: Pulmonary effort is normal. No respiratory distress.     Breath sounds: Normal breath sounds. No stridor. No wheezing, rhonchi or rales.  Musculoskeletal:        General: Normal range of motion.     Cervical back: Normal range of motion.  Skin:    General: Skin is warm and dry.     Capillary Refill: Capillary refill takes less than 2 seconds.  Neurological:     General: No focal deficit present.     Mental Status: She is alert and oriented to person, place, and time. Mental status is at baseline.  Psychiatric:        Mood and Affect: Mood normal.        Behavior: Behavior normal.        Thought Content: Thought content normal.        Judgment: Judgment normal.    Lab Results  Component Value Date   TSH 2.030 10/21/2019   Lab Results  Component Value Date   WBC 10.9 (H) 04/19/2021   HGB 10.9 (L) 04/19/2021   HCT 32.9 (L) 04/19/2021   MCV 91.4 04/19/2021   PLT 250 04/19/2021   Lab Results  Component Value Date   NA 134 (L) 04/19/2021   K 4.8 04/19/2021   CO2 22 04/19/2021   GLUCOSE 131 (H) 04/19/2021   BUN 13 04/19/2021   CREATININE 1.22 (H) 04/19/2021   BILITOT 0.4 04/18/2021   ALKPHOS 69 04/18/2021   AST 18 04/18/2021   ALT 11 04/18/2021   PROT 5.5 (L) 04/18/2021   ALBUMIN 3.2 (L) 04/18/2021   CALCIUM 8.6 (L) 04/19/2021   ANIONGAP 5 04/19/2021   EGFR 61 02/19/2021   Lab Results  Component Value Date   CHOL 171 02/19/2021   Lab  Results  Component Value Date   HDL 29 (L) 02/19/2021   Lab Results  Component Value Date   LDLCALC 98 02/19/2021   Lab Results  Component Value Date   TRIG 258 (H) 02/19/2021   Lab Results  Component Value Date   CHOLHDL 5.9 (H) 02/19/2021   Lab Results  Component Value Date   HGBA1C 8.0 (H) 02/19/2021

## 2021-05-23 ENCOUNTER — Other Ambulatory Visit: Payer: Self-pay | Admitting: Family Medicine

## 2021-05-23 DIAGNOSIS — R944 Abnormal results of kidney function studies: Secondary | ICD-10-CM

## 2021-05-23 DIAGNOSIS — E871 Hypo-osmolality and hyponatremia: Secondary | ICD-10-CM

## 2021-05-23 LAB — ANEMIA PROFILE B
Basophils Absolute: 0 10*3/uL (ref 0.0–0.2)
Basos: 0 %
EOS (ABSOLUTE): 0.2 10*3/uL (ref 0.0–0.4)
Eos: 2 %
Ferritin: 151 ng/mL — ABNORMAL HIGH (ref 15–150)
Folate: 4.4 ng/mL (ref >3.0)
Hematocrit: 34.1 % (ref 34.0–46.6)
Hemoglobin: 11.6 g/dL (ref 11.1–15.9)
Immature Grans (Abs): 0 10*3/uL (ref 0.0–0.1)
Immature Granulocytes: 0 %
Iron Saturation: 60 % — ABNORMAL HIGH (ref 15–55)
Iron: 130 ug/dL (ref 27–159)
Lymphocytes Absolute: 3.1 10*3/uL (ref 0.7–3.1)
Lymphs: 34 %
MCH: 30.3 pg (ref 26.6–33.0)
MCHC: 34 g/dL (ref 31.5–35.7)
MCV: 89 fL (ref 79–97)
Monocytes Absolute: 0.5 10*3/uL (ref 0.1–0.9)
Monocytes: 5 %
Neutrophils Absolute: 5.2 10*3/uL (ref 1.4–7.0)
Neutrophils: 59 %
Platelets: 219 10*3/uL (ref 150–450)
RBC: 3.83 x10E6/uL (ref 3.77–5.28)
RDW: 13.7 % (ref 11.7–15.4)
Retic Ct Pct: 2.5 % (ref 0.6–2.6)
Total Iron Binding Capacity: 215 ug/dL — ABNORMAL LOW (ref 250–450)
UIBC: 85 ug/dL — ABNORMAL LOW (ref 131–425)
Vitamin B-12: 502 pg/mL (ref 232–1245)
WBC: 9 10*3/uL (ref 3.4–10.8)

## 2021-05-23 LAB — CMP14+EGFR
ALT: 6 IU/L (ref 0–32)
AST: 10 IU/L (ref 0–40)
Albumin/Globulin Ratio: 1.8 (ref 1.2–2.2)
Albumin: 3.5 g/dL — ABNORMAL LOW (ref 3.8–4.9)
Alkaline Phosphatase: 101 IU/L (ref 44–121)
BUN/Creatinine Ratio: 6 — ABNORMAL LOW (ref 12–28)
BUN: 9 mg/dL (ref 8–27)
Bilirubin Total: <0.2 mg/dL (ref 0.0–1.2)
CO2: 23 mmol/L (ref 20–29)
Calcium: 8.5 mg/dL — ABNORMAL LOW (ref 8.7–10.3)
Chloride: 86 mmol/L — ABNORMAL LOW (ref 96–106)
Creatinine, Ser: 1.48 mg/dL — ABNORMAL HIGH (ref 0.57–1.00)
Globulin, Total: 2 g/dL (ref 1.5–4.5)
Glucose: 85 mg/dL (ref 65–99)
Potassium: 3.8 mmol/L (ref 3.5–5.2)
Sodium: 121 mmol/L — ABNORMAL LOW (ref 134–144)
Total Protein: 5.5 g/dL — ABNORMAL LOW (ref 6.0–8.5)
eGFR: 40 mL/min/{1.73_m2} — ABNORMAL LOW (ref >59)

## 2021-05-23 LAB — LIPID PANEL
Chol/HDL Ratio: 3.9 ratio (ref 0.0–4.4)
Cholesterol, Total: 139 mg/dL (ref 100–199)
HDL: 36 mg/dL — ABNORMAL LOW (ref >39)
LDL Chol Calc (NIH): 84 mg/dL (ref 0–99)
Triglycerides: 102 mg/dL (ref 0–149)
VLDL Cholesterol Cal: 19 mg/dL (ref 5–40)

## 2021-05-23 LAB — HCV INTERPRETATION

## 2021-05-23 LAB — HCV AB W REFLEX TO QUANT PCR: HCV Ab: <0.1 s/co ratio (ref 0.0–0.9)

## 2021-05-27 ENCOUNTER — Other Ambulatory Visit: Payer: Self-pay | Admitting: Family Medicine

## 2021-05-27 DIAGNOSIS — G8929 Other chronic pain: Secondary | ICD-10-CM

## 2021-06-03 ENCOUNTER — Other Ambulatory Visit: Payer: Self-pay | Admitting: Family Medicine

## 2021-06-03 DIAGNOSIS — M545 Low back pain, unspecified: Secondary | ICD-10-CM

## 2021-06-03 DIAGNOSIS — G8929 Other chronic pain: Secondary | ICD-10-CM

## 2021-06-04 ENCOUNTER — Telehealth: Payer: Self-pay | Admitting: Family Medicine

## 2021-06-12 ENCOUNTER — Ambulatory Visit
Admission: RE | Admit: 2021-06-12 | Discharge: 2021-06-12 | Disposition: A | Discharge status: Home / Self Care | Payer: Medicare Other | Source: Ambulatory Visit | Attending: Family Medicine | Admitting: Family Medicine

## 2021-06-12 ENCOUNTER — Other Ambulatory Visit: Payer: Medicare Other

## 2021-06-12 ENCOUNTER — Other Ambulatory Visit: Payer: Self-pay

## 2021-06-12 DIAGNOSIS — Z1231 Encounter for screening mammogram for malignant neoplasm of breast: Secondary | ICD-10-CM

## 2021-06-12 DIAGNOSIS — R944 Abnormal results of kidney function studies: Secondary | ICD-10-CM

## 2021-06-12 DIAGNOSIS — E871 Hypo-osmolality and hyponatremia: Secondary | ICD-10-CM

## 2021-06-13 LAB — BMP8+EGFR
BUN/Creatinine Ratio: 7 — ABNORMAL LOW (ref 12–28)
BUN: 6 mg/dL — ABNORMAL LOW (ref 8–27)
CO2: 19 mmol/L — ABNORMAL LOW (ref 20–29)
Calcium: 8.9 mg/dL (ref 8.7–10.3)
Chloride: 108 mmol/L — ABNORMAL HIGH (ref 96–106)
Creatinine, Ser: 0.91 mg/dL (ref 0.57–1.00)
Glucose: 143 mg/dL — ABNORMAL HIGH (ref 65–99)
Potassium: 4 mmol/L (ref 3.5–5.2)
Sodium: 143 mmol/L (ref 134–144)
eGFR: 72 mL/min/{1.73_m2} (ref >59)

## 2021-07-08 ENCOUNTER — Telehealth: Payer: Self-pay

## 2021-08-13 ENCOUNTER — Ambulatory Visit (INDEPENDENT_AMBULATORY_CARE_PROVIDER_SITE_OTHER): Payer: Medicare Other | Admitting: Family Medicine

## 2021-08-13 ENCOUNTER — Encounter: Payer: Self-pay | Admitting: Family Medicine

## 2021-08-13 VITALS — BP 135/73 | HR 101 | Temp 97.4°F

## 2021-08-13 DIAGNOSIS — J40 Bronchitis, not specified as acute or chronic: Secondary | ICD-10-CM | POA: Diagnosis not present

## 2021-08-13 DIAGNOSIS — R051 Acute cough: Secondary | ICD-10-CM | POA: Diagnosis not present

## 2021-08-13 DIAGNOSIS — J329 Chronic sinusitis, unspecified: Secondary | ICD-10-CM | POA: Diagnosis not present

## 2021-08-13 MED ORDER — AZITHROMYCIN 250 MG PO TABS: ORAL_TABLET | ORAL | 0 refills | Status: DC

## 2021-08-13 NOTE — Progress Notes (Signed)
Chief Complaint  Patient presents with   Cough   Nasal Congestion   Fatigue    HPI  Patient presents today for Patient presents with upper respiratory congestion. Rhinorrhea that is frequently purulent. There is moderate sore throat. Patient reports coughing frequently as well.  yellow sputum noted. There is no fever, chills or sweats. The patient denies being short of breath. Onset was 2 weeks ago. Gradually worsening. Tried OTCs without improvement. Daily smoker.  PMH: Smoking status noted ROS: Per HPI  Objective: BP 135/73   Pulse (!) 101   Temp (!) 97.4 F (36.3 C)   SpO2 98%  Gen: NAD, alert, cooperative with exam HEENT: NCAT, Nasal passages swollen, TMs clear CV: RRR, good S1/S2, no murmur Resp: Bronchitis changes with scattered wheezes, non-labored Ext: No edema, warm Neuro: Alert and oriented, No gross deficits  Assessment and plan:  1. Acute cough   2. Sinobronchitis     Meds ordered this encounter  Medications   azithromycin (ZITHROMAX Z-PAK) 250 MG tablet    Sig: Take two right away Then one a day for the next 4 days.    Dispense:  6 each    Refill:  0     Orders Placed This Encounter  Procedures   Novel Coronavirus, NAA ([facility])    Order Specific Question:   Previously tested for COVID-19    Answer:   Yes    Order Specific Question:   Resident in a congregate (group) care setting    Answer:   No    Order Specific Question:   Is the patient student?    Answer:   No    Order Specific Question:   Employed in healthcare setting    Answer:   No    Order Specific Question:   Pregnant    Answer:   No    Order Specific Question:   Has patient completed COVID vaccination(s) (2 doses of Pfizer/Moderna 1 dose of [employer])    Answer:   No    Order Specific Question:   Release to patient    Answer:   Immediate   Veritor Flu A/B Waived    Order Specific Question:   Source    Answer:   nasal swab    Order Specific Question:   Release to patient     Answer:   Immediate    Follow up as needed.  Mechele Claude, MD

## 2021-08-14 LAB — VERITOR FLU A/B WAIVED
Influenza A: NEGATIVE
Influenza B: NEGATIVE

## 2021-08-14 LAB — SARS-COV-2, NAA 2 DAY TAT

## 2021-08-14 LAB — NOVEL CORONAVIRUS, NAA: SARS-CoV-2, NAA: NOT DETECTED

## 2021-08-22 ENCOUNTER — Encounter: Payer: Self-pay | Admitting: Family Medicine

## 2021-08-22 ENCOUNTER — Other Ambulatory Visit: Payer: Self-pay

## 2021-08-22 ENCOUNTER — Other Ambulatory Visit (HOSPITAL_COMMUNITY)
Admission: RE | Admit: 2021-08-22 | Discharge: 2021-08-22 | Disposition: A | Discharge status: Home / Self Care | Payer: Medicare Other | Source: Ambulatory Visit | Attending: Family Medicine | Admitting: Family Medicine

## 2021-08-22 ENCOUNTER — Ambulatory Visit (INDEPENDENT_AMBULATORY_CARE_PROVIDER_SITE_OTHER): Payer: Medicare Other

## 2021-08-22 ENCOUNTER — Ambulatory Visit (INDEPENDENT_AMBULATORY_CARE_PROVIDER_SITE_OTHER): Payer: Medicare Other | Admitting: Family Medicine

## 2021-08-22 VITALS — BP 142/88 | HR 89 | Temp 97.9°F | Ht 66.0 in | Wt 123.0 lb

## 2021-08-22 DIAGNOSIS — E1165 Type 2 diabetes mellitus with hyperglycemia: Secondary | ICD-10-CM

## 2021-08-22 DIAGNOSIS — Z1151 Encounter for screening for human papillomavirus (HPV): Secondary | ICD-10-CM

## 2021-08-22 DIAGNOSIS — Z23 Encounter for immunization: Secondary | ICD-10-CM

## 2021-08-22 DIAGNOSIS — Z113 Encounter for screening for infections with a predominantly sexual mode of transmission: Secondary | ICD-10-CM | POA: Diagnosis not present

## 2021-08-22 DIAGNOSIS — Z0001 Encounter for general adult medical examination with abnormal findings: Secondary | ICD-10-CM

## 2021-08-22 DIAGNOSIS — J449 Chronic obstructive pulmonary disease, unspecified: Secondary | ICD-10-CM

## 2021-08-22 DIAGNOSIS — M546 Pain in thoracic spine: Secondary | ICD-10-CM

## 2021-08-22 DIAGNOSIS — G8929 Other chronic pain: Secondary | ICD-10-CM

## 2021-08-22 DIAGNOSIS — Z0000 Encounter for general adult medical examination without abnormal findings: Secondary | ICD-10-CM

## 2021-08-22 DIAGNOSIS — M545 Low back pain, unspecified: Secondary | ICD-10-CM

## 2021-08-22 DIAGNOSIS — F331 Major depressive disorder, recurrent, moderate: Secondary | ICD-10-CM

## 2021-08-22 DIAGNOSIS — Z01419 Encounter for gynecological examination (general) (routine) without abnormal findings: Secondary | ICD-10-CM | POA: Diagnosis present

## 2021-08-22 DIAGNOSIS — K219 Gastro-esophageal reflux disease without esophagitis: Secondary | ICD-10-CM

## 2021-08-22 DIAGNOSIS — Z124 Encounter for screening for malignant neoplasm of cervix: Secondary | ICD-10-CM

## 2021-08-22 DIAGNOSIS — E1142 Type 2 diabetes mellitus with diabetic polyneuropathy: Secondary | ICD-10-CM

## 2021-08-22 DIAGNOSIS — Z8679 Personal history of other diseases of the circulatory system: Secondary | ICD-10-CM

## 2021-08-22 DIAGNOSIS — R197 Diarrhea, unspecified: Secondary | ICD-10-CM

## 2021-08-22 DIAGNOSIS — Z720 Tobacco use: Secondary | ICD-10-CM

## 2021-08-22 DIAGNOSIS — E1122 Type 2 diabetes mellitus with diabetic chronic kidney disease: Secondary | ICD-10-CM

## 2021-08-22 DIAGNOSIS — N183 Chronic kidney disease, stage 3 unspecified: Secondary | ICD-10-CM

## 2021-08-22 DIAGNOSIS — E785 Hyperlipidemia, unspecified: Secondary | ICD-10-CM

## 2021-08-22 DIAGNOSIS — E1169 Type 2 diabetes mellitus with other specified complication: Secondary | ICD-10-CM

## 2021-08-22 DIAGNOSIS — F411 Generalized anxiety disorder: Secondary | ICD-10-CM

## 2021-08-22 LAB — BAYER DCA HB A1C WAIVED: HB A1C (BAYER DCA - WAIVED): 6.4 % — ABNORMAL HIGH (ref 4.8–5.6)

## 2021-08-22 MED ORDER — ESOMEPRAZOLE MAGNESIUM 20 MG PO PACK: 20.0000 mg | PACK | Freq: Every day | ORAL | 1 refills | Status: AC

## 2021-08-22 MED ORDER — EZETIMIBE 10 MG PO TABS: 10.0000 mg | ORAL_TABLET | Freq: Every day | ORAL | 1 refills | Status: AC

## 2021-08-22 MED ORDER — GABAPENTIN 600 MG PO TABS: 600.0000 mg | ORAL_TABLET | Freq: Two times a day (BID) | ORAL | 1 refills | Status: AC

## 2021-08-22 MED ORDER — GLYXAMBI 25-5 MG PO TABS: 1.0000 | ORAL_TABLET | Freq: Every day | ORAL | 1 refills | Status: AC

## 2021-08-22 MED ORDER — HYDROXYZINE HCL 25 MG PO TABS: ORAL_TABLET | ORAL | 1 refills | Status: AC

## 2021-08-22 MED ORDER — ESCITALOPRAM OXALATE 20 MG PO TABS: 20.0000 mg | ORAL_TABLET | Freq: Every day | ORAL | 1 refills | Status: AC

## 2021-08-22 NOTE — Patient Instructions (Signed)
Schedule your diabetic eye exam.  

## 2021-08-22 NOTE — Progress Notes (Signed)
Assessment & Plan:  1. Well adult exam Preventive health education provided. Cologuard printed off from their website to be scanned to the chart. - Lipid panel - CBC with Differential/Platelet - CMP14+EGFR  2. Screening for cervical cancer - Cytology - PAP  3. Routine screening for STI (sexually transmitted infection) - Cytology - PAP  4. Screening for human papillomavirus (HPV) - Cytology - PAP  5. Type 2 diabetes mellitus with hyperglycemia, without long-term current use of insulin (HCC) Lab Results  Component Value Date   HGBA1C 6.4 (H) 08/22/2021   HGBA1C 5.8 05/22/2021   HGBA1C 8.0 (H) 02/19/2021    - Diabetes is at goal of A1c < 7. - Medications:  diet controlled - Patient is currently taking a statin. Patient is not taking an ACE-inhibitor/ARB.  - Instruction/counseling given: reminded to bring blood glucose meter & log to each visit  Diabetes Health Maintenance Due  Topic Date Due   OPHTHALMOLOGY EXAM  Never done   URINE MICROALBUMIN  11/16/2021   FOOT EXAM  02/19/2022   HEMOGLOBIN A1C  02/19/2022    Lab Results  Component Value Date   LABMICR 4.8 11/16/2020   LABMICR <3.0 10/21/2019   - Lipid panel - CBC with Differential/Platelet - CMP14+EGFR - Bayer DCA Hb A1c Waived - Empagliflozin-linaGLIPtin (GLYXAMBI) 25-5 MG TABS; Take 1 tablet by mouth daily.  Dispense: 90 tablet; Refill: 1  6. Diabetic polyneuropathy associated with type 2 diabetes mellitus (HCC) - gabapentin (NEURONTIN) 600 MG tablet; Take 1 tablet (600 mg total) by mouth 2 (two) times daily.  Dispense: 180 tablet; Refill: 1  7. History of hypertension BP elevated today, but patient reports good readings at home.  8. CKD stage 3 due to type 2 diabetes mellitus (HCC) - CMP14+EGFR  9. Hyperlipidemia due to type 2 diabetes mellitus (HCC) - Lipid panel - CMP14+EGFR - ezetimibe (ZETIA) 10 MG tablet; Take 1 tablet (10 mg total) by mouth daily.  Dispense: 90 tablet; Refill: 1  10.  Gastroesophageal reflux disease without esophagitis Well controlled on current regimen.  - esomeprazole (NEXIUM) 20 MG packet; Take 20 mg by mouth daily before breakfast.  Dispense: 90 each; Refill: 1  11. Generalized anxiety disorder Well controlled on current regimen.  - hydrOXYzine (ATARAX/VISTARIL) 25 MG tablet; TAKE 1 TABLET UP TO 3 TIMES A DAY FOR ANXIETY  Dispense: 90 tablet; Refill: 1 - escitalopram (LEXAPRO) 20 MG tablet; Take 1 tablet (20 mg total) by mouth daily.  Dispense: 90 tablet; Refill: 1  12. Moderate episode of recurrent major depressive disorder (HCC) Well controlled on current regimen.  - escitalopram (LEXAPRO) 20 MG tablet; Take 1 tablet (20 mg total) by mouth daily.  Dispense: 90 tablet; Refill: 1  13. Diarrhea in adult patient - Clostridium difficile EIA; Future - Ova and parasite examination; Future - Stool culture; Future  14. Chronic bilateral thoracic back pain Encouraged to schedule an appointment to discuss pain management.  - DG Thoracic Spine W/Swimmers  15. Chronic bilateral low back pain without sciatica Encouraged to schedule an appointment to discuss pain management.  - DG Lumbar Spine 2-3 Views  16. COPD without exacerbation ([place]) Well controlled on current regimen.  - Pneumococcal conjugate vaccine 20-valent (Prevnar 20)  17. Immunization due - Pneumococcal conjugate vaccine 20-valent (Prevnar 20)  18. Tobacco use - Pneumococcal conjugate vaccine 20-valent (Prevnar 20) - CT CHEST LUNG CA SCREEN LOW DOSE W/O CM; Future   Follow-up: Return in about 6 months (around 02/19/2022) for follow-up of chronic medication  conditions.   Hendricks Limes, MSN, APRN, FNP-C [facility]  Subjective:  Patient ID: Felicia Figueroa, female    DOB: 04/08/1961  Age: 60 y.o. MRN: [id number]  Patient Care Team: Loman Brooklyn, FNP as PCP - General (Family Medicine) Minus Breeding, MD as PCP - Cardiology (Cardiology) Lavera Guise, [facility] (Pharmacist) Harlen Labs, MD as Referring Physician (Optometry)   CC:  Chief Complaint  Patient presents with   Gynecologic Exam   Diarrhea    After eating on and off     HPI Felicia Figueroa presents for her annual physical.  Occupation: Disabled, Marital status: Divorced, Substance use: None Diet: regular, Exercise: none Last eye exam: never Last dental exam: edentulous Last colonoscopy: Cologuard completed in March 2020 Last mammogram: 06/12/2021 Last pap smear: unknown Lung Cancer Screening with low-dose Chest CT: agreeable in getting Hepatitis C Screening: negative on 05/22/2021 Immunizations: Flu Vaccine: declined Tdap Vaccine: up to date  Shingrix Vaccine: declined  COVID-19 Vaccine: declined Pneumonia Vaccine:  agreeable to get  DEPRESSION SCREENING PHQ 2/9 Scores 08/22/2021 08/13/2021 05/22/2021 04/17/2021 04/04/2021 02/19/2021 11/27/2020  PHQ - 2 Score 3 0 0 3 0 2 1  PHQ- 9 Score 8 - 1 8 0 12 -     Hypertension: patient does check her blood pressure at home and reports readings 120s/70s.   Chronic back pain: patient reports pain in the middle and low back that has been present since her 54s. She previously saw pain management and had injections completed. She recently has been taking Tylenol arthritis and Aleve which have not been very helpful. She is questioning if her left hip is involved.  Diarrhea: she reports diarrhea that started when she was admitted to the hospital in July. States her bowel movements are like water and smell "rank". She is having 2-3 bowel movements every day.    Review of Systems  Constitutional:  Negative for chills, fever, malaise/fatigue and weight loss.  HENT:  Negative for congestion, ear discharge, ear pain, nosebleeds, sinus pain, sore throat and tinnitus.   Eyes:  Negative for blurred vision, double vision, pain, discharge and redness.  Respiratory:  Negative for cough, shortness of breath and wheezing.    Cardiovascular:  Negative for chest pain, palpitations and leg swelling.  Gastrointestinal:  Positive for diarrhea. Negative for abdominal pain, constipation, heartburn, nausea and vomiting.  Genitourinary:  Negative for dysuria, frequency and urgency.  Musculoskeletal:  Positive for back pain and joint pain. Negative for myalgias.  Skin:  Negative for rash.  Neurological:  Negative for dizziness, seizures, weakness and headaches.  Psychiatric/Behavioral:  Negative for depression, substance abuse and suicidal ideas. The patient is not nervous/anxious.     Current Outpatient Medications:    albuterol (VENTOLIN HFA) 108 (90 Base) MCG/ACT inhaler, Inhale 2 puffs into the lungs every 6 (six) hours as needed for wheezing or shortness of breath., Disp: 18 g, Rfl: 2   aspirin EC 81 MG tablet, Take 1 tablet (81 mg total) by mouth daily., Disp: 90 tablet, Rfl: 3   atorvastatin (LIPITOR) 80 MG tablet, Take 1 tablet (80 mg total) by mouth daily., Disp: 90 tablet, Rfl: 3   Empagliflozin-linaGLIPtin (GLYXAMBI) 25-5 MG TABS, Take 1 tablet by mouth daily., Disp: 90 tablet, Rfl: 1   escitalopram (LEXAPRO) 20 MG tablet, Take 1 tablet (20 mg total) by mouth daily., Disp: 90 tablet, Rfl: 1   esomeprazole (NEXIUM) 20 MG packet, Take 20 mg by mouth daily before breakfast.,  Disp: 30 each, Rfl: 12   ezetimibe (ZETIA) 10 MG tablet, Take 1 tablet (10 mg total) by mouth daily., Disp: 30 tablet, Rfl: 2   gabapentin (NEURONTIN) 600 MG tablet, Take 1 tablet (600 mg total) by mouth 2 (two) times daily., Disp: 180 tablet, Rfl: 1   glucose blood (ONETOUCH VERIO) Figueroa strip, Use to Figueroa blood sugar daily as directed. DX: E11.9, Disp: 100 each, Rfl: 12   hydrOXYzine (ATARAX/VISTARIL) 25 MG tablet, TAKE 1 TABLET UP TO 3 TIMES A DAY FOR ANXIETY, Disp: 90 tablet, Rfl: 1   OneTouch Delica Lancets 23F MISC, Use to Figueroa blood sugar daily as directed. DX: E11.9, Disp: 100 each, Rfl: 3   umeclidinium-vilanterol (ANORO ELLIPTA) 62.5-25  MCG/INH AEPB, Inhale 1 puff into the lungs daily., Disp: 52 each, Rfl: 3  Allergies  Allergen Reactions   Cephalexin Nausea And Vomiting   Hydrocodone-Acetaminophen Itching, Nausea And Vomiting and Rash   Sulfa Antibiotics Rash   Metformin And Related Diarrhea    Past Medical History:  Diagnosis Date   Anxiety    CAD (coronary artery disease)    COPD (chronic obstructive pulmonary disease) (HCC)    Depression    Diabetes ([place])    Diabetic nephropathy (HCC)    History of MI (myocardial infarction)    Hypertension    PVD (peripheral vascular disease) ([place])     Past Surgical History:  Procedure Laterality Date   AMPUTATION TOE     BREAST EXCISIONAL BIOPSY Bilateral    many years ago- in her 27s   [place]     stents placed    Family History  Problem Relation Age of Onset   Depression Mother    Stroke Mother    COPD Father    Cirrhosis Father    Diabetes Sister    Anxiety disorder Sister    Tremor Sister    Congestive Heart Failure Sister        No details, sudden death   Obesity Daughter     Social History   Socioeconomic History   Marital status: Divorced    Spouse name: Not on file   Number of children: 2   Years of education: 10   Highest education level: 10th grade  Occupational History   Occupation: Disabled  Tobacco Use   Smoking status: Every Day    Packs/day: 1.00    Years: 35.00    Pack years: 35.00    Types: Cigarettes   Smokeless tobacco: Never  Vaping Use   Vaping Use: Never used  Substance and Sexual Activity   Alcohol use: Not Currently   Drug use: Never   Sexual activity: Not Currently  Other Topics Concern   Not on file  Social History Narrative   Lives alone.   Two daughters.    Social Determinants of Health   Financial Resource Strain: Not on file  Food Insecurity: Not on file  Transportation Needs: Not on file  Physical Activity: Not on file  Stress: Not on file  Social Connections:  Not on file  Intimate Partner Violence: Not on file      Objective:    BP (!) 165/88   Pulse 89   Temp 97.9 F (36.6 C) (Temporal)   Ht 5' 6"  (1.676 m)   Wt 123 lb (55.8 kg)   SpO2 98%   BMI 19.85 kg/m   Wt Readings from Last 3 Encounters:  08/22/21 123 lb (  55.8 kg)  05/22/21 126 lb (57.2 kg)  04/18/21 130 lb (59 kg)    Physical Exam Vitals reviewed. Exam conducted with a chaperone present.  Constitutional:      General: She is not in acute distress.    Appearance: Normal appearance. She is underweight. She is not ill-appearing, toxic-appearing or diaphoretic.  HENT:     Head: Normocephalic and atraumatic.     Right Ear: Tympanic membrane, ear canal and external ear normal. There is no impacted cerumen.     Left Ear: Tympanic membrane, ear canal and external ear normal. There is no impacted cerumen.     Nose: Nose normal. No congestion or rhinorrhea.     Mouth/Throat:     Mouth: Mucous membranes are moist.     Pharynx: Oropharynx is clear. No oropharyngeal exudate or posterior oropharyngeal erythema.  Eyes:     General: No scleral icterus.       Right eye: No discharge.        Left eye: No discharge.     Conjunctiva/sclera: Conjunctivae normal.     Pupils: Pupils are equal, round, and reactive to light.  Cardiovascular:     Rate and Rhythm: Normal rate and regular rhythm.     Heart sounds: Normal heart sounds. No murmur heard.   No friction rub. No gallop.  Pulmonary:     Effort: Pulmonary effort is normal. No respiratory distress.     Breath sounds: Normal breath sounds. No stridor. No wheezing, rhonchi or rales.  Abdominal:     General: Abdomen is flat. Bowel sounds are normal. There is no distension.     Palpations: Abdomen is soft. There is no hepatomegaly, splenomegaly or mass.     Tenderness: There is no abdominal tenderness. There is no guarding or rebound.     Hernia: No hernia is present.  Genitourinary:    Pubic Area: No rash.      Labia:         Right: No rash, tenderness, lesion or injury.        Left: No rash, tenderness, lesion or injury.      Vagina: No signs of injury and foreign body. Vaginal discharge (thick) present. No erythema, tenderness, bleeding, lesions or prolapsed vaginal walls.     Cervix: Normal.     Uterus: Normal.      Adnexa: Right adnexa normal and left adnexa normal.  Musculoskeletal:        General: Normal range of motion.     Cervical back: Normal range of motion and neck supple. No rigidity. No muscular tenderness.  Lymphadenopathy:     Cervical: No cervical adenopathy.  Skin:    General: Skin is warm and dry.     Capillary Refill: Capillary refill takes less than 2 seconds.  Neurological:     General: No focal deficit present.     Mental Status: She is alert and oriented to person, place, and time. Mental status is at baseline.  Psychiatric:        Mood and Affect: Mood normal.        Behavior: Behavior normal.        Thought Content: Thought content normal.        Judgment: Judgment normal.    Lab Results  Component Value Date   TSH 2.030 10/21/2019   Lab Results  Component Value Date   WBC 9.0 05/22/2021   HGB 11.6 05/22/2021   HCT 34.1 05/22/2021   MCV 89 05/22/2021  PLT 219 05/22/2021   Lab Results  Component Value Date   NA 143 06/12/2021   K 4.0 06/12/2021   CO2 19 (L) 06/12/2021   GLUCOSE 143 (H) 06/12/2021   BUN 6 (L) 06/12/2021   CREATININE 0.91 06/12/2021   BILITOT <0.2 05/22/2021   ALKPHOS 101 05/22/2021   AST 10 05/22/2021   ALT 6 05/22/2021   PROT 5.5 (L) 05/22/2021   ALBUMIN 3.5 (L) 05/22/2021   CALCIUM 8.9 06/12/2021   ANIONGAP 5 04/19/2021   EGFR 72 06/12/2021   Lab Results  Component Value Date   CHOL 139 05/22/2021   Lab Results  Component Value Date   HDL 36 (L) 05/22/2021   Lab Results  Component Value Date   LDLCALC 84 05/22/2021   Lab Results  Component Value Date   TRIG 102 05/22/2021   Lab Results  Component Value Date   CHOLHDL 3.9  05/22/2021   Lab Results  Component Value Date   HGBA1C 5.8 05/22/2021

## 2021-08-23 ENCOUNTER — Encounter: Payer: Self-pay | Admitting: Family Medicine

## 2021-08-23 LAB — CMP14+EGFR
ALT: 7 IU/L (ref 0–32)
AST: 9 IU/L (ref 0–40)
Albumin/Globulin Ratio: 1.6 (ref 1.2–2.2)
Albumin: 3.7 g/dL — ABNORMAL LOW (ref 3.8–4.8)
Alkaline Phosphatase: 120 IU/L (ref 44–121)
BUN/Creatinine Ratio: 5 — ABNORMAL LOW (ref 12–28)
BUN: 5 mg/dL — ABNORMAL LOW (ref 8–27)
Bilirubin Total: <0.2 mg/dL (ref 0.0–1.2)
CO2: 24 mmol/L (ref 20–29)
Calcium: 8.8 mg/dL (ref 8.7–10.3)
Chloride: 104 mmol/L (ref 96–106)
Creatinine, Ser: 1.09 mg/dL — ABNORMAL HIGH (ref 0.57–1.00)
Globulin, Total: 2.3 g/dL (ref 1.5–4.5)
Glucose: 177 mg/dL — ABNORMAL HIGH (ref 70–99)
Potassium: 3.7 mmol/L (ref 3.5–5.2)
Sodium: 140 mmol/L (ref 134–144)
Total Protein: 6 g/dL (ref 6.0–8.5)
eGFR: 58 mL/min/{1.73_m2} — ABNORMAL LOW (ref >59)

## 2021-08-23 LAB — CBC WITH DIFFERENTIAL/PLATELET
Basophils Absolute: 0.1 10*3/uL (ref 0.0–0.2)
Basos: 1 %
EOS (ABSOLUTE): 0.1 10*3/uL (ref 0.0–0.4)
Eos: 1 %
Hematocrit: 41.9 % (ref 34.0–46.6)
Hemoglobin: 13.5 g/dL (ref 11.1–15.9)
Immature Grans (Abs): 0 10*3/uL (ref 0.0–0.1)
Immature Granulocytes: 0 %
Lymphocytes Absolute: 2.4 10*3/uL (ref 0.7–3.1)
Lymphs: 33 %
MCH: 31.3 pg (ref 26.6–33.0)
MCHC: 32.2 g/dL (ref 31.5–35.7)
MCV: 97 fL (ref 79–97)
Monocytes Absolute: 0.4 10*3/uL (ref 0.1–0.9)
Monocytes: 5 %
Neutrophils Absolute: 4.4 10*3/uL (ref 1.4–7.0)
Neutrophils: 60 %
Platelets: 240 10*3/uL (ref 150–450)
RBC: 4.31 x10E6/uL (ref 3.77–5.28)
RDW: 12.9 % (ref 11.7–15.4)
WBC: 7.4 10*3/uL (ref 3.4–10.8)

## 2021-08-23 LAB — LIPID PANEL
Chol/HDL Ratio: 4.4 ratio (ref 0.0–4.4)
Cholesterol, Total: 179 mg/dL (ref 100–199)
HDL: 41 mg/dL (ref >39)
LDL Chol Calc (NIH): 111 mg/dL — ABNORMAL HIGH (ref 0–99)
Triglycerides: 154 mg/dL — ABNORMAL HIGH (ref 0–149)
VLDL Cholesterol Cal: 27 mg/dL (ref 5–40)

## 2021-08-26 LAB — COLOGUARD: Cologuard: NEGATIVE

## 2021-08-28 LAB — CYTOLOGY - PAP
Chlamydia: NEGATIVE
Comment: NEGATIVE
Comment: NEGATIVE
Comment: NEGATIVE
Comment: NORMAL
Diagnosis: NEGATIVE
Diagnosis: REACTIVE
High risk HPV: NEGATIVE
Neisseria Gonorrhea: NEGATIVE
Trichomonas: NEGATIVE

## 2021-09-03 ENCOUNTER — Ambulatory Visit (INDEPENDENT_AMBULATORY_CARE_PROVIDER_SITE_OTHER): Payer: Medicare Other | Admitting: Nurse Practitioner

## 2021-09-03 ENCOUNTER — Encounter: Payer: Self-pay | Admitting: Nurse Practitioner

## 2021-09-03 VITALS — Temp 100.0°F

## 2021-09-03 DIAGNOSIS — J069 Acute upper respiratory infection, unspecified: Secondary | ICD-10-CM | POA: Diagnosis not present

## 2021-09-03 LAB — VERITOR FLU A/B WAIVED
Influenza A: NEGATIVE
Influenza B: NEGATIVE

## 2021-09-03 NOTE — Progress Notes (Signed)
   Virtual Visit  Note Due to COVID-19 pandemic this visit was conducted virtually. This visit type was conducted due to national recommendations for restrictions regarding the COVID-19 Pandemic (e.g. social distancing, sheltering in place) in an effort to limit this patient's exposure and mitigate transmission in our community. All issues noted in this document were discussed and addressed.  A physical exam was not performed with this format.  I connected with [name] on 09/03/21 at 9:10 AM  by telephone and verified that I am speaking with the correct person using two identifiers. Felicia Figueroa is currently located at home during visit. The provider, Daryll Drown, NP is located in their office at time of visit.  I discussed the limitations, risks, security and privacy concerns of performing an evaluation and management service by telephone and the availability of in person appointments. I also discussed with the patient that there may be a patient responsible charge related to this service. The patient expressed understanding and agreed to proceed.   History and Present Illness:  URI  This is a new problem. The current episode started yesterday. The problem has been unchanged. The maximum temperature recorded prior to her arrival was 100.4 - 100.9 F. Associated symptoms include coughing, ear pain, headaches, nausea, a sore throat and vomiting. Pertinent negatives include no abdominal pain or rash. She has tried nothing for the symptoms.     Review of Systems  Constitutional:  Positive for chills and fever.  HENT:  Positive for ear pain and sore throat.   Respiratory:  Positive for cough.   Gastrointestinal:  Positive for nausea and vomiting. Negative for abdominal pain.  Skin:  Negative for rash.  Neurological:  Positive for headaches.    Observations/Objective: Tele-visit patient not in distress  Assessment and Plan: Take meds as prescribed - Use a cool mist  humidifier  -Use saline nose sprays frequently -Force fluids -For fever or aches or pains- take Tylenol or ibuprofen. -COVID-19/flu swab completed results pending. Follow up with worsening unresolved symptoms   Follow Up Instructions: Follow up with unresolved symptom    I discussed the assessment and treatment plan with the patient. The patient was provided an opportunity to ask questions and all were answered. The patient agreed with the plan and demonstrated an understanding of the instructions.   The patient was advised to call back or seek an in-person evaluation if the symptoms worsen or if the condition fails to improve as anticipated.  The above assessment and management plan was discussed with the patient. The patient verbalized understanding of and has agreed to the management plan. Patient is aware to call the clinic if symptoms persist or worsen. Patient is aware when to return to the clinic for a follow-up visit. Patient educated on when it is appropriate to go to the emergency department.   Time call ended: 9:20 AM  I provided 10 minutes of  non face-to-face time during this encounter.    Daryll Drown, NP

## 2021-09-03 NOTE — Assessment & Plan Note (Signed)
Take meds as prescribed - Use a cool mist humidifier  -Use saline nose sprays frequently -Force fluids -For fever or aches or pains- take Tylenol or ibuprofen. -COVID-19/flu swab completed results pending. Follow up with worsening unresolved symptoms       

## 2021-09-04 ENCOUNTER — Other Ambulatory Visit: Payer: Self-pay | Admitting: Nurse Practitioner

## 2021-09-04 LAB — SARS-COV-2, NAA 2 DAY TAT

## 2021-09-04 LAB — NOVEL CORONAVIRUS, NAA: SARS-CoV-2, NAA: DETECTED — AB

## 2021-09-04 NOTE — Progress Notes (Signed)
I called patient at 06:53 pm to notify her that her covid -19 results is positive. I wanted to prescribe antiviral but I was unable to reach patient over the phone and left her a voice message to call on call since [facility] is closed until Monday.

## 2021-09-09 NOTE — Progress Notes (Signed)
Told pt about results and she wants something called in

## 2021-11-26 ENCOUNTER — Telehealth: Payer: Self-pay | Admitting: Family Medicine

## 2021-12-11 NOTE — Telephone Encounter (Signed)
Officer Engineer, production with the [place] [employer] called to make [place] aware patient just suffered a fairly severe heart attack. He reports patient went outside to smoke a cigarette. A neighbor observed her to be unconscious and without a pulse at which time CPR was started and 911 was called. EMS worked on patient and has not been able to get a pulse back. Patient was pronounced dead at 12:49 PM. We will sign the death certificate.

## 2021-12-11 DEATH — deceased

## 2023-03-03 IMAGING — DX DG LUMBAR SPINE 2-3V
3 series · 3 of 3 positions shown · non-contrast
Comparison: None.

CLINICAL DATA: Chronic lower back pain, history of fall

EXAM:
LUMBAR SPINE - 2-3 VIEW

[l-spine ap]
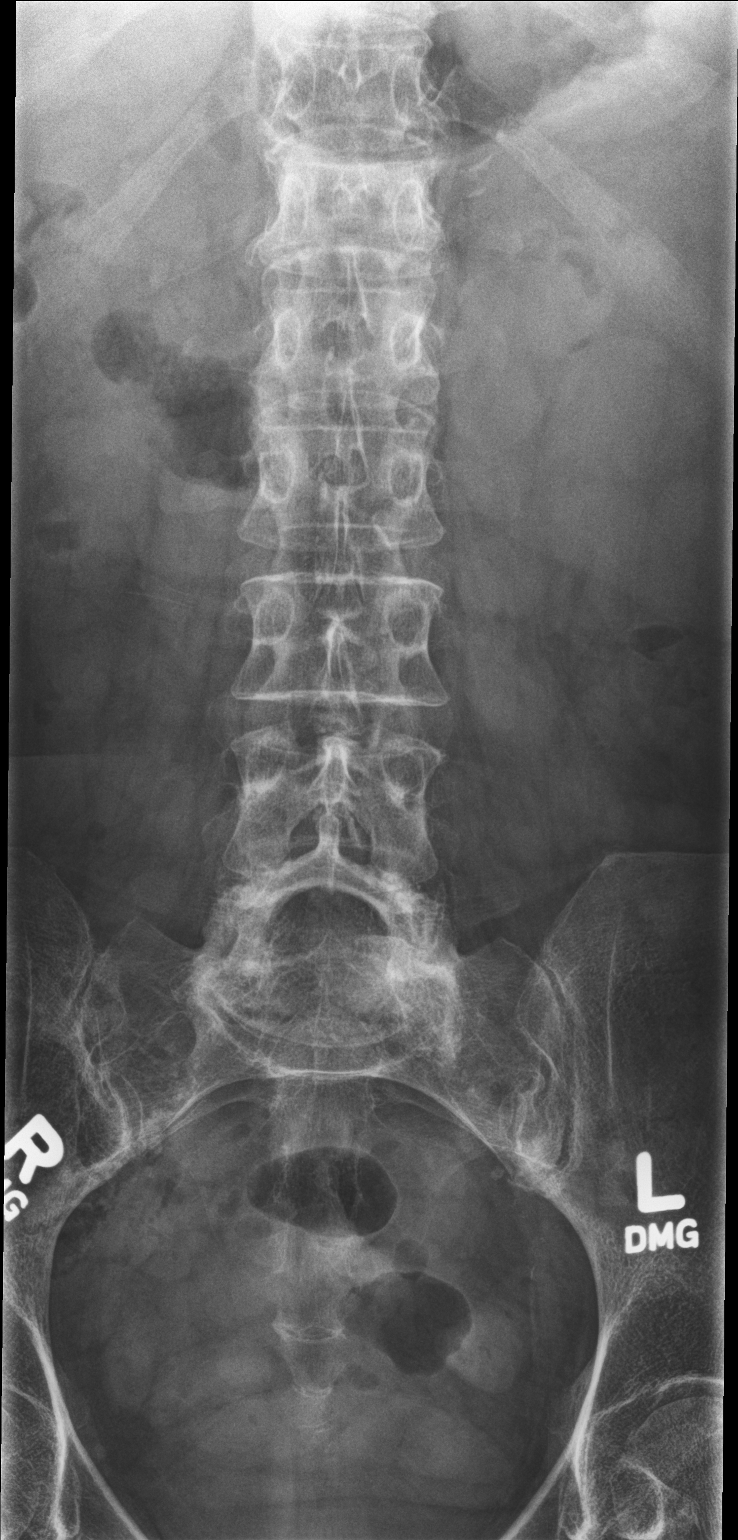

[l-spine lat (1 of 2)]
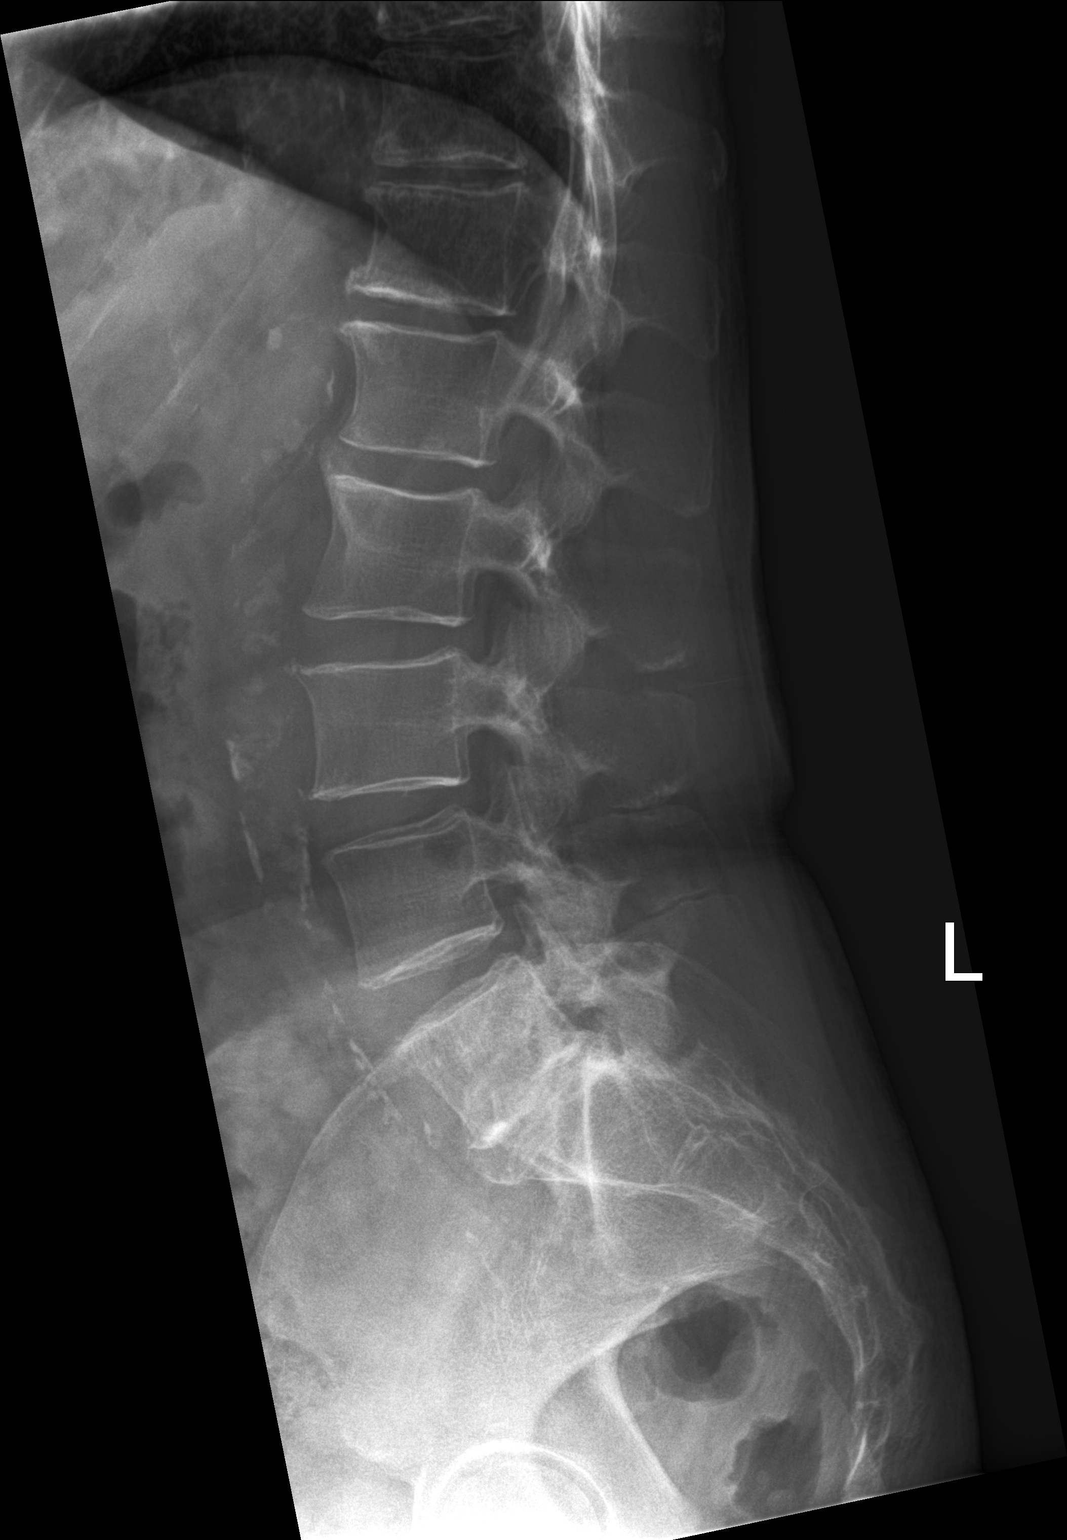

[l-spine lat (2 of 2)]
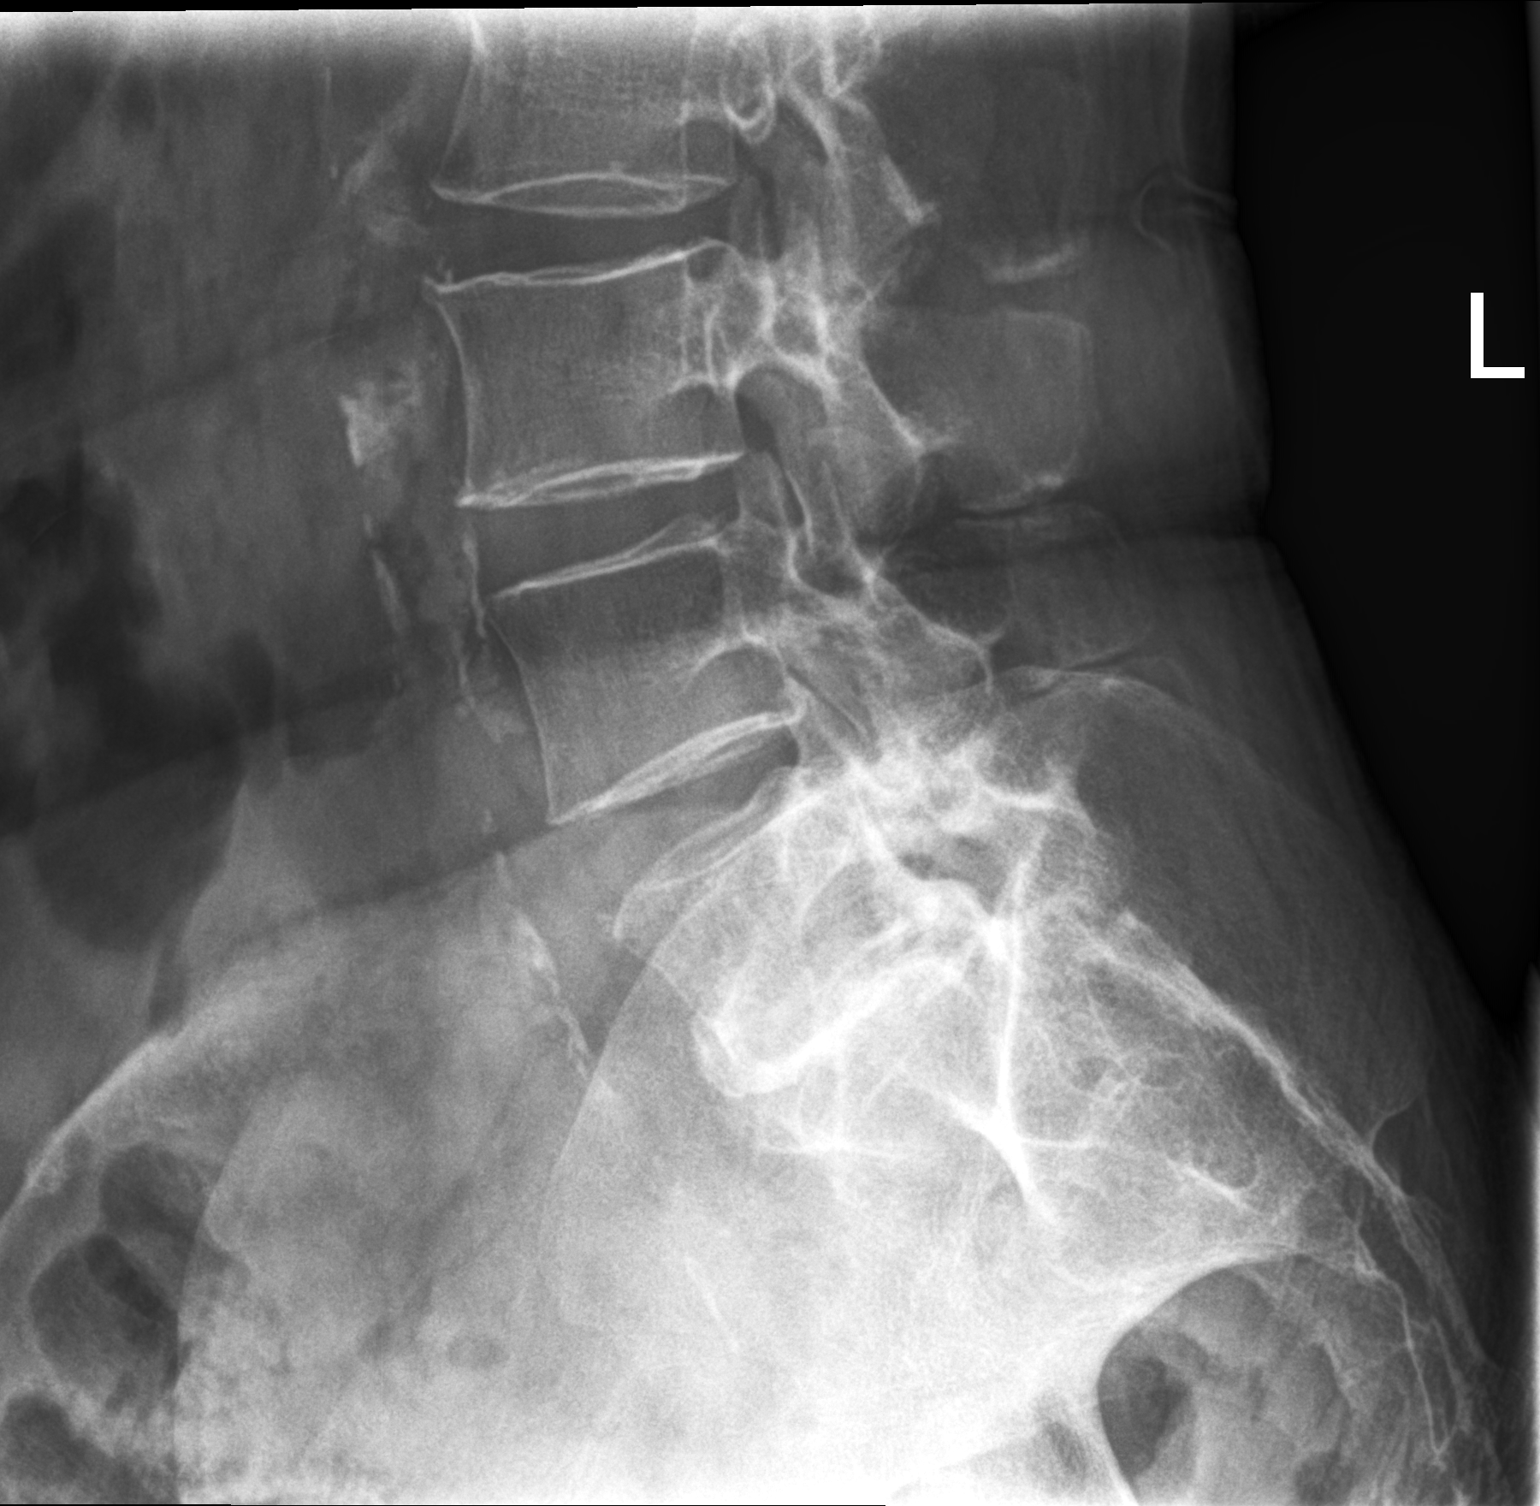

[3 of 3 positions shown; findings below may reference images not displayed]

FINDINGS: Frontal and lateral views of the lumbar spine demonstrate 5
non-rib-bearing lumbar type vertebral bodies in grossly normal
alignment. There are no acute displaced fractures. There is moderate
spondylosis and facet hypertrophy at the L5-S1 level. Remaining disc
spaces are relatively well preserved. Sacroiliac joints are grossly
unremarkable.
IMPRESSION: 1. Prominent spondylosis and facet hypertrophy at L5-S1.
2. No acute fracture.

## 2023-03-03 IMAGING — DX DG THORACIC SPINE 3V
3 series · 3 of 3 positions shown · non-contrast
Comparison: 04/17/2021

CLINICAL DATA: Chronic bilateral thoracic back pain

EXAM:
THORACIC SPINE - 3 VIEWS

[t-spine ap]
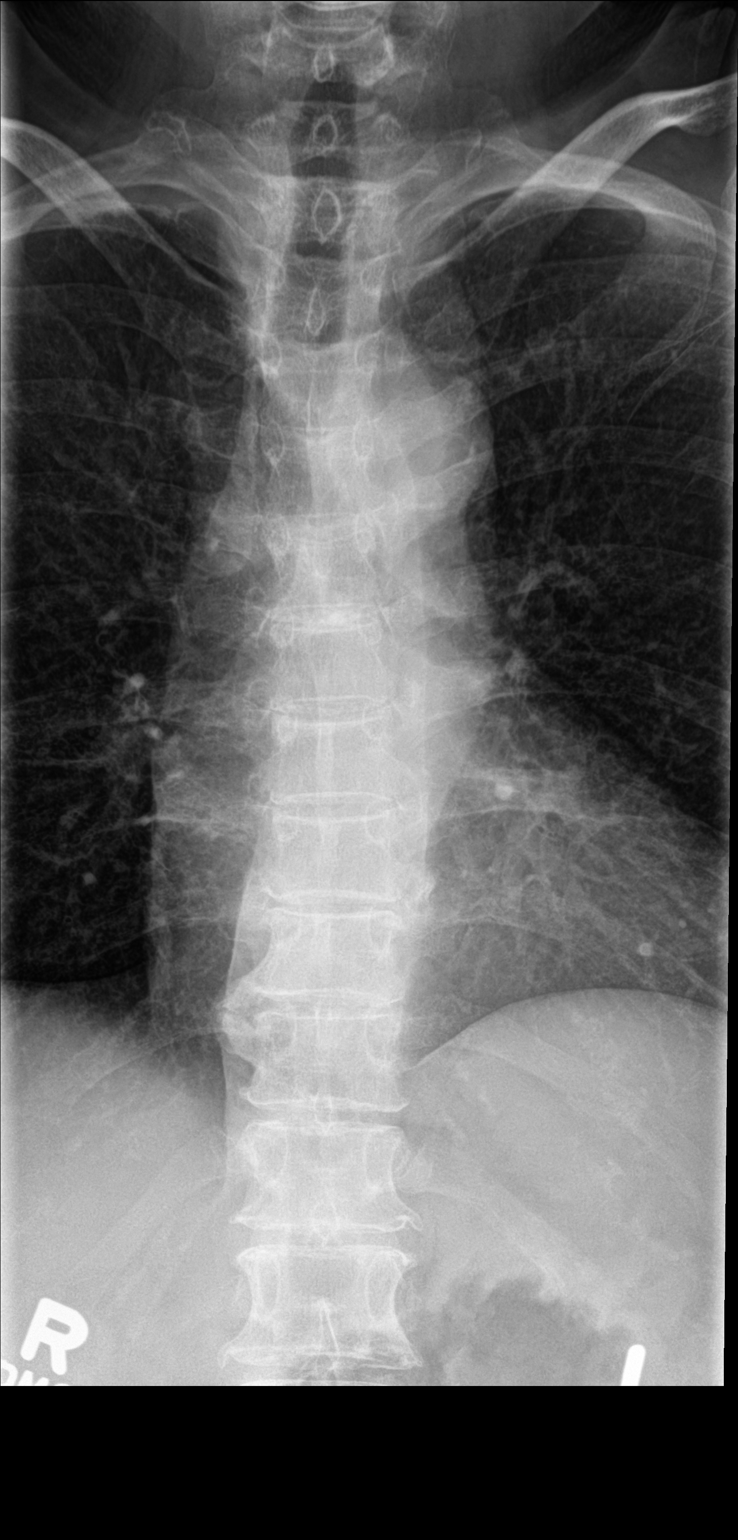

[t-spine lat]
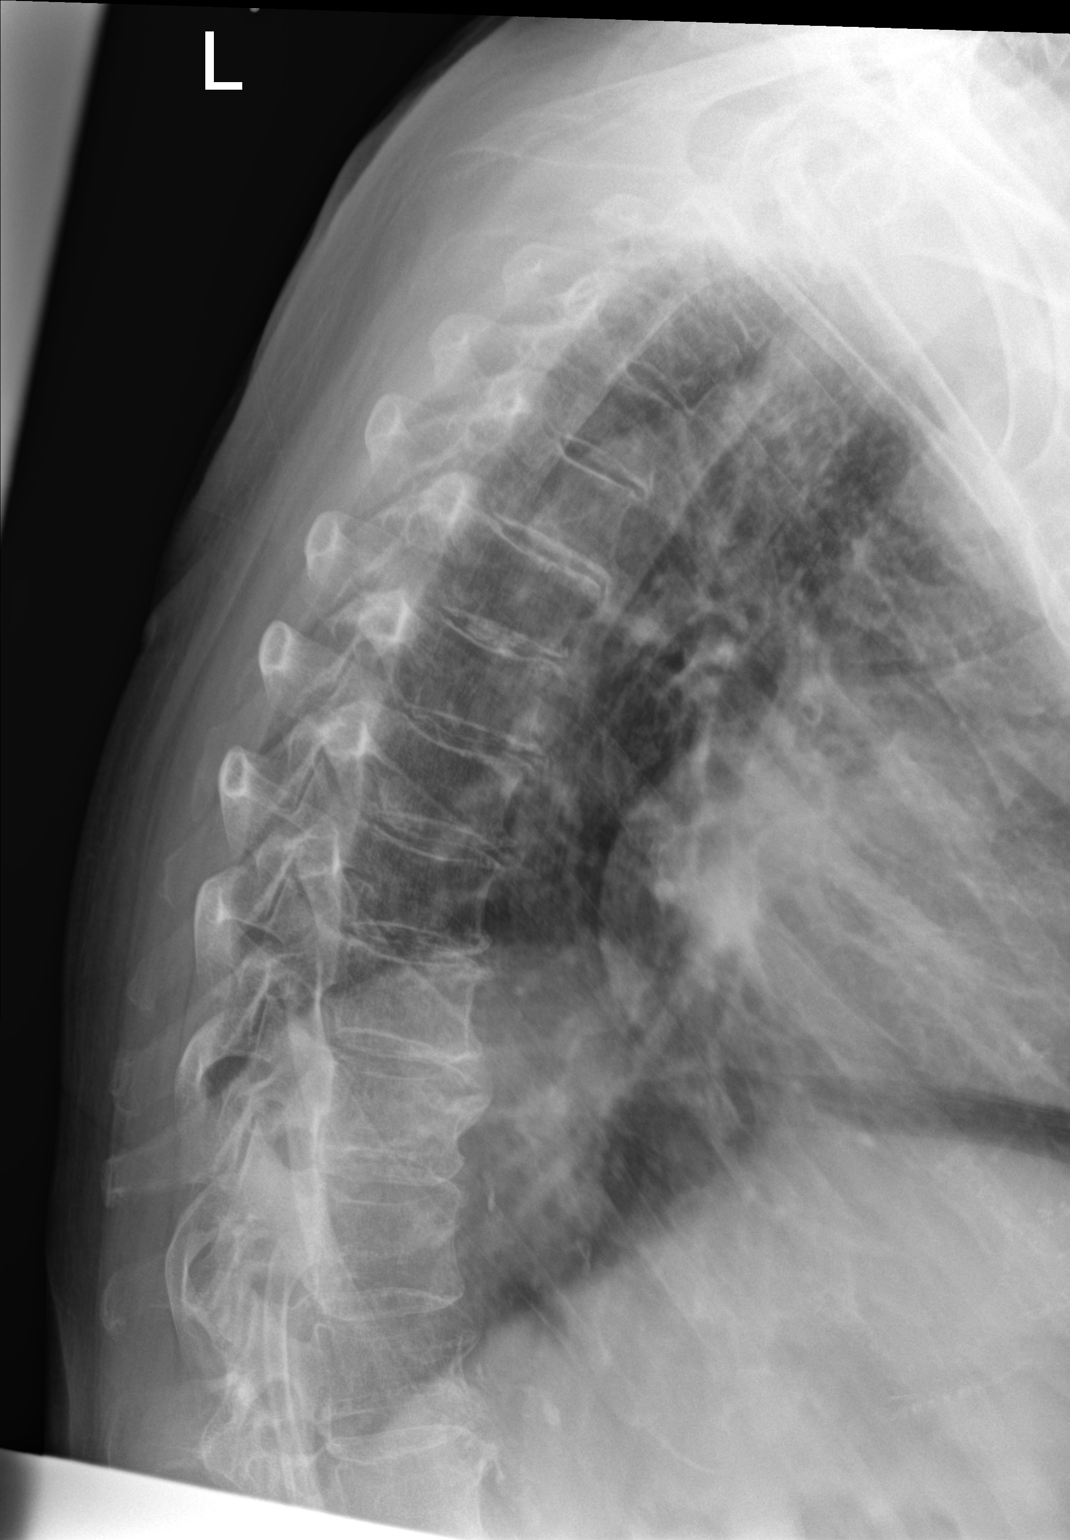

[t-spine lat swimmers]
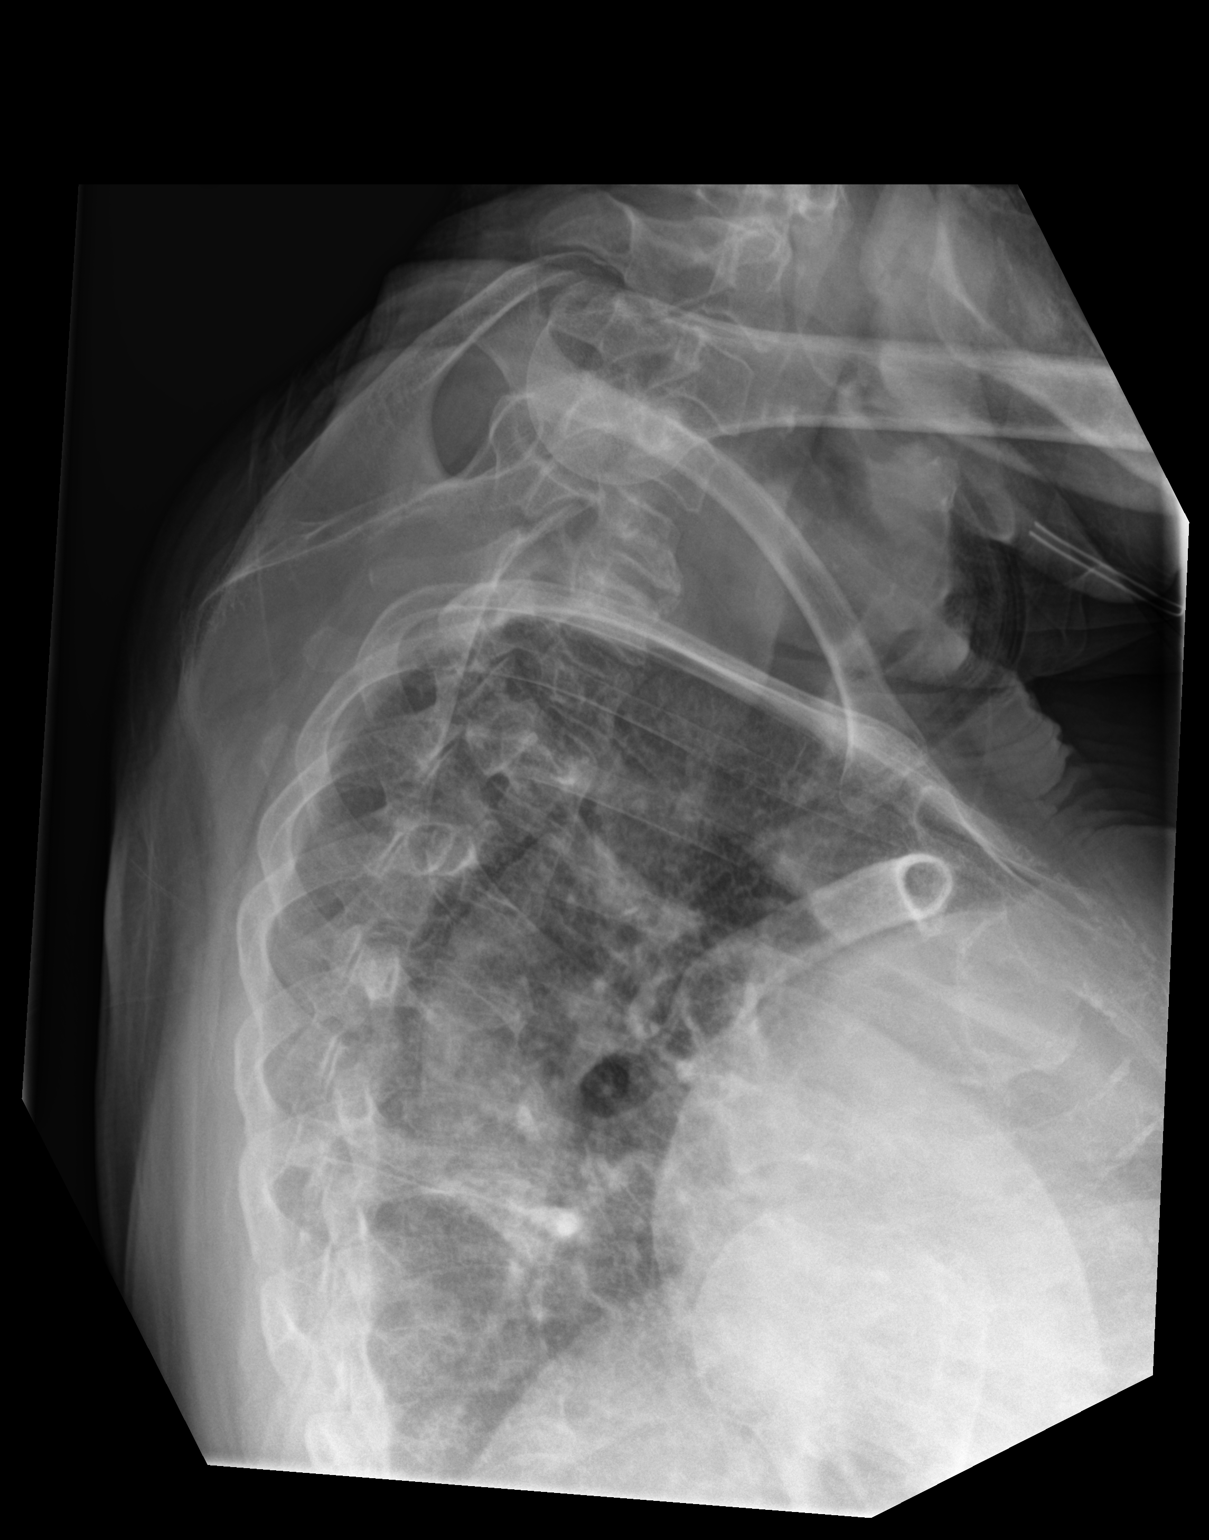

[3 of 3 positions shown; findings below may reference images not displayed]

FINDINGS: Frontal and lateral views of the thoracic spine are obtained. There
is mild S shaped scoliosis measuring less than 5 degrees. Otherwise
alignment is anatomic. Mild diffuse spondylosis greatest at the
thoracolumbar junction. No acute fractures. Paraspinal soft tissues
are unremarkable.
IMPRESSION: 1. Mild lower thoracic spondylosis.  Mild S shaped scoliosis.
2. No acute fracture.
